# Patient Record
Sex: Male | Born: 1985
Health system: Southern US, Community
[De-identification: ages and names within clinical notes are randomized; demographics above are authoritative.]

## PROBLEM LIST (undated history)

## (undated) DIAGNOSIS — N200 Calculus of kidney: Secondary | ICD-10-CM

## (undated) HISTORY — PX: FRACTURE SURGERY: SHX138

## (undated) HISTORY — PX: KNEE RECONSTRUCTION: SHX5883

---
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
## 2010-04-20 ENCOUNTER — Emergency Department (HOSPITAL_COMMUNITY): Admission: EM | Admit: 2010-04-20 | Discharge: 2010-04-21 | Payer: Self-pay | Admitting: Emergency Medicine

## 2010-11-18 LAB — GC/CHLAMYDIA PROBE AMP, GENITAL
Chlamydia, DNA Probe: NEGATIVE
GC Probe Amp, Genital: NEGATIVE

## 2011-04-20 ENCOUNTER — Encounter: Payer: Self-pay | Admitting: Family Medicine

## 2011-10-05 ENCOUNTER — Ambulatory Visit: Payer: Self-pay | Admitting: Family Medicine

## 2012-12-01 ENCOUNTER — Emergency Department (HOSPITAL_BASED_OUTPATIENT_CLINIC_OR_DEPARTMENT_OTHER)
Admission: EM | Admit: 2012-12-01 | Discharge: 2012-12-01 | Disposition: A | Discharge status: Home / Self Care | Payer: BC Managed Care – PPO | Attending: Emergency Medicine | Admitting: Emergency Medicine

## 2012-12-01 ENCOUNTER — Encounter (HOSPITAL_BASED_OUTPATIENT_CLINIC_OR_DEPARTMENT_OTHER): Payer: Self-pay | Admitting: *Deleted

## 2012-12-01 DIAGNOSIS — Z8781 Personal history of (healed) traumatic fracture: Secondary | ICD-10-CM | POA: Insufficient documentation

## 2012-12-01 DIAGNOSIS — Z4802 Encounter for removal of sutures: Secondary | ICD-10-CM | POA: Insufficient documentation
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
## 2012-12-01 NOTE — ED Notes (Signed)
Pt states he had staples placed 10 days ago. No signs of infection.

## 2012-12-03 NOTE — ED Provider Notes (Signed)
Medical screening examination/treatment/procedure(s) were performed by non-physician practitioner and as supervising physician I was immediately available for consultation/collaboration.  Christopher J. Pollina, MD 12/03/12 0847 

## 2013-07-23 ENCOUNTER — Encounter (HOSPITAL_BASED_OUTPATIENT_CLINIC_OR_DEPARTMENT_OTHER): Payer: Self-pay | Admitting: Emergency Medicine

## 2013-07-23 ENCOUNTER — Emergency Department (HOSPITAL_BASED_OUTPATIENT_CLINIC_OR_DEPARTMENT_OTHER): Payer: Self-pay

## 2013-07-23 ENCOUNTER — Emergency Department (HOSPITAL_BASED_OUTPATIENT_CLINIC_OR_DEPARTMENT_OTHER)
Admission: EM | Admit: 2013-07-23 | Discharge: 2013-07-23 | Disposition: A | Discharge status: Home / Self Care | Payer: Self-pay | Attending: Emergency Medicine | Admitting: Emergency Medicine

## 2013-07-23 DIAGNOSIS — N451 Epididymitis: Secondary | ICD-10-CM

## 2013-07-23 DIAGNOSIS — N453 Epididymo-orchitis: Secondary | ICD-10-CM | POA: Insufficient documentation

## 2013-07-23 DIAGNOSIS — F17200 Nicotine dependence, unspecified, uncomplicated: Secondary | ICD-10-CM | POA: Insufficient documentation

## 2013-07-23 MED ORDER — OXYCODONE-ACETAMINOPHEN 5-325 MG PO TABS: 1.0000 | ORAL_TABLET | ORAL | Status: DC | PRN

## 2013-07-23 MED ORDER — OXYCODONE-ACETAMINOPHEN 5-325 MG PO TABS
2.0000 | ORAL_TABLET | Freq: Once | ORAL | Status: AC
  Administered 2013-07-23: 2 via ORAL
  Filled 2013-07-23: qty 2

## 2013-07-23 MED ORDER — IBUPROFEN 800 MG PO TABS: 800.0000 mg | ORAL_TABLET | Freq: Three times a day (TID) | ORAL | Status: DC

## 2013-07-23 MED ORDER — CEFTRIAXONE SODIUM 250 MG IJ SOLR
250.0000 mg | Freq: Once | INTRAMUSCULAR | Status: AC
  Administered 2013-07-23: 250 mg via INTRAMUSCULAR
  Filled 2013-07-23: qty 250

## 2013-07-23 MED ORDER — DOXYCYCLINE HYCLATE 100 MG PO TABS
100.0000 mg | ORAL_TABLET | Freq: Once | ORAL | Status: AC
  Administered 2013-07-23: 100 mg via ORAL
  Filled 2013-07-23: qty 1

## 2013-07-23 MED ORDER — DOXYCYCLINE HYCLATE 100 MG PO TABS: 100.0000 mg | ORAL_TABLET | Freq: Two times a day (BID) | ORAL | Status: DC

## 2013-07-23 NOTE — ED Notes (Signed)
Pt c/o left testicle pain x 2 days

## 2013-07-23 NOTE — ED Provider Notes (Signed)
Medical screening examination/treatment/procedure(s) were performed by non-physician practitioner and as supervising physician I was immediately available for consultation/collaboration.  EKG Interpretation   None         Gwyneth Sprout, MD 07/23/13 2136

## 2013-07-23 NOTE — ED Provider Notes (Signed)
CSN: [id number]     Arrival date & time 07/23/13  1827 History   First MD Initiated Contact with Patient 07/23/13 1843     Chief Complaint  Patient presents with  . Testicle Pain   (Consider location/radiation/quality/duration/timing/severity/associated sxs/prior Treatment) Patient is a 27 y.o. male presenting with testicular pain. The history is provided by the patient. No language interpreter was used.  Testicle Pain This is a new problem. The current episode started yesterday. The problem occurs constantly. The problem has been gradually worsening. Associated symptoms include abdominal pain. Pertinent negatives include no chills or fever. Associated symptoms comments: Left sided testicular pain for 2 days without injury. He denies testicular or scrotal swelling. No dysuria or difficulty with urination. He has lower abdominal pain as well. No N, V..    History reviewed. No pertinent past medical history. Past Surgical History  Procedure Laterality Date  . Fracture surgery    . Knee reconstruction     History reviewed. No pertinent family history. History  Substance Use Topics  . Smoking status: Current Every Day Smoker -- 0.50 packs/day    Types: Cigarettes  . Smokeless tobacco: Not on file  . Alcohol Use: No    Review of Systems  Constitutional: Negative for fever and chills.  Gastrointestinal: Positive for abdominal pain.  Genitourinary: Positive for testicular pain. Negative for dysuria, discharge, scrotal swelling and penile pain.  Musculoskeletal: Negative.   Skin: Negative.     Allergies  Review of patient's allergies indicates no known allergies.  Home Medications  No current outpatient prescriptions on file. BP 121/75  Pulse 59  Temp(Src) 98.6 F (37 C) (Oral)  Resp 16  Ht 6\' 2"  (1.88 m)  Wt 225 lb (102.059 kg)  BMI 28.88 kg/m2 Physical Exam  Constitutional: He is oriented to person, place, and time. He appears well-developed and well-nourished.  Neck:  Normal range of motion.  Pulmonary/Chest: Effort normal.  Abdominal: Soft.  Lower abdominal/suprapublic tenderness. No masses.   Genitourinary:  Circumcised penis. No scrotal swelling. Right testicle nontender. Left testicle significantly tender without swelling.   Musculoskeletal: Normal range of motion.  Neurological: He is alert and oriented to person, place, and time.  Skin: Skin is warm and dry.  Psychiatric: He has a normal mood and affect.    ED Course  Procedures (including critical care time) Labs Review Labs Reviewed - No data to display Imaging Review [place] Art/ven Flow Abd Pelv Doppler  07/23/2013   CLINICAL DATA:  Scrotal pain  EXAM: SCROTAL ULTRASOUND; SCROTAL DOPPLER ULTRASOUND  TECHNIQUE: Real-time and Doppler interrogation of the scrotal contents was performed.  COMPARISON:  None.  FINDINGS: Right testis measures 4.3 x 2.4 x 2.8 cm in size. Left testis measures 4.2 x 2.2 x 2.4 cm in size. There are no testicular masses. There is color flow in both testes, upper normal to borderline prominent on each side.  Both testes demonstrate low resistance waveforms. The peak systolic velocity in the right testis is 6 cm/sec. There is venous outflow on the right. On the left, the peak systolic velocity is 4 cm/sec. Venous outflow is demonstrated on the left.  There is no extratesticular mass or appreciable hydrocele on either side. The epididymal structures do not appear enlarged ; they do appear mildly hyperemic, however.  There are no varicoceles with Valsalva maneuver. There is no scrotal wall thickening or abscess.  IMPRESSION: Mild epididymal hyperemia bilaterally consistent with inflammation. Flow is noted each testis. There is borderline prominent blood flow to  each testis. Early orchitis may also be present. There is no demonstrable testicular mass or torsion. No evidence of hydrocele or abscess.   Electronically Signed   By: Bretta Bang M.D.   On: 07/23/2013 19:44    EKG  Interpretation   None       MDM  No diagnosis found. 1. Epididymitis  Will treat with antibiotics and recommend supportive treatment otherwise. Follow up urology if symptoms persist.    Arnoldo Hooker, PA-C 07/23/13 2053

## 2013-11-17 ENCOUNTER — Encounter (HOSPITAL_BASED_OUTPATIENT_CLINIC_OR_DEPARTMENT_OTHER): Payer: Self-pay | Admitting: Emergency Medicine

## 2013-11-17 ENCOUNTER — Emergency Department (HOSPITAL_BASED_OUTPATIENT_CLINIC_OR_DEPARTMENT_OTHER): Payer: BC Managed Care – PPO

## 2013-11-17 ENCOUNTER — Emergency Department (HOSPITAL_BASED_OUTPATIENT_CLINIC_OR_DEPARTMENT_OTHER)
Admission: EM | Admit: 2013-11-17 | Discharge: 2013-11-17 | Disposition: A | Discharge status: Home / Self Care | Payer: Self-pay | Attending: Emergency Medicine | Admitting: Emergency Medicine

## 2013-11-17 DIAGNOSIS — F17200 Nicotine dependence, unspecified, uncomplicated: Secondary | ICD-10-CM | POA: Insufficient documentation

## 2013-11-17 DIAGNOSIS — R519 Headache, unspecified: Secondary | ICD-10-CM

## 2013-11-17 DIAGNOSIS — Z792 Long term (current) use of antibiotics: Secondary | ICD-10-CM | POA: Insufficient documentation

## 2013-11-17 DIAGNOSIS — R5381 Other malaise: Secondary | ICD-10-CM | POA: Insufficient documentation

## 2013-11-17 DIAGNOSIS — R5383 Other fatigue: Secondary | ICD-10-CM

## 2013-11-17 DIAGNOSIS — R51 Headache: Secondary | ICD-10-CM | POA: Insufficient documentation

## 2013-11-17 DIAGNOSIS — Z87828 Personal history of other (healed) physical injury and trauma: Secondary | ICD-10-CM | POA: Insufficient documentation

## 2013-11-17 DIAGNOSIS — G8929 Other chronic pain: Secondary | ICD-10-CM | POA: Insufficient documentation

## 2013-11-17 DIAGNOSIS — Z791 Long term (current) use of non-steroidal anti-inflammatories (NSAID): Secondary | ICD-10-CM | POA: Insufficient documentation

## 2013-11-17 LAB — COMPREHENSIVE METABOLIC PANEL
ALBUMIN: 4.7 g/dL (ref 3.5–5.2)
ALK PHOS: 80 U/L (ref 39–117)
ALT: 14 U/L (ref 0–53)
AST: 16 U/L (ref 0–37)
BUN: 13 mg/dL (ref 6–23)
CO2: 26 mEq/L (ref 19–32)
Calcium: 9.8 mg/dL (ref 8.4–10.5)
Chloride: 101 mEq/L (ref 96–112)
Creatinine, Ser: 1.2 mg/dL (ref 0.50–1.35)
GFR calc Af Amer: >90 mL/min (ref >90)
GFR calc non Af Amer: 80 mL/min — ABNORMAL LOW (ref >90)
Glucose, Bld: 108 mg/dL — ABNORMAL HIGH (ref 70–99)
POTASSIUM: 3.5 meq/L — AB (ref 3.7–5.3)
SODIUM: 141 meq/L (ref 137–147)
TOTAL PROTEIN: 8.1 g/dL (ref 6.0–8.3)
Total Bilirubin: 0.9 mg/dL (ref 0.3–1.2)

## 2013-11-17 LAB — CBC WITH DIFFERENTIAL/PLATELET
BASOS PCT: 1 % (ref 0–1)
Basophils Absolute: 0.1 10*3/uL (ref 0.0–0.1)
EOS ABS: 0.2 10*3/uL (ref 0.0–0.7)
Eosinophils Relative: 2 % (ref 0–5)
HCT: 44.8 % (ref 39.0–52.0)
Hemoglobin: 15.9 g/dL (ref 13.0–17.0)
LYMPHS ABS: 3.8 10*3/uL (ref 0.7–4.0)
Lymphocytes Relative: 43 % (ref 12–46)
MCH: 29.4 pg (ref 26.0–34.0)
MCHC: 35.5 g/dL (ref 30.0–36.0)
MCV: 82.8 fL (ref 78.0–100.0)
Monocytes Absolute: 0.9 10*3/uL (ref 0.1–1.0)
Monocytes Relative: 10 % (ref 3–12)
NEUTROS PCT: 45 % (ref 43–77)
Neutro Abs: 4 10*3/uL (ref 1.7–7.7)
PLATELETS: 214 10*3/uL (ref 150–400)
RBC: 5.41 MIL/uL (ref 4.22–5.81)
RDW: 13.8 % (ref 11.5–15.5)
WBC: 8.9 10*3/uL (ref 4.0–10.5)

## 2013-11-17 LAB — RAPID URINE DRUG SCREEN, HOSP PERFORMED
Amphetamines: NOT DETECTED
Barbiturates: NOT DETECTED
Benzodiazepines: NOT DETECTED
Cocaine: NOT DETECTED
OPIATES: NOT DETECTED
TETRAHYDROCANNABINOL: POSITIVE — AB

## 2013-11-17 LAB — ETHANOL: Alcohol, Ethyl (B): <11 mg/dL (ref 0–11)

## 2013-11-17 MED ORDER — BUTALBITAL-APAP-CAFFEINE 50-325-40 MG PO TABS: 1.0000 | ORAL_TABLET | Freq: Four times a day (QID) | ORAL | Status: AC | PRN

## 2013-11-17 MED ORDER — SODIUM CHLORIDE 0.9 % IV SOLN
INTRAVENOUS | Status: DC
  Administered 2013-11-17: 125 mL/h via INTRAVENOUS

## 2013-11-17 MED ORDER — DIPHENHYDRAMINE HCL 50 MG/ML IJ SOLN
25.0000 mg | Freq: Once | INTRAMUSCULAR | Status: AC
  Administered 2013-11-17: 25 mg via INTRAVENOUS
  Filled 2013-11-17: qty 1

## 2013-11-17 MED ORDER — KETOROLAC TROMETHAMINE 30 MG/ML IJ SOLN
30.0000 mg | Freq: Once | INTRAMUSCULAR | Status: AC
  Administered 2013-11-17: 30 mg via INTRAVENOUS
  Filled 2013-11-17: qty 1
  Filled 2013-11-17: qty 2

## 2013-11-17 MED ORDER — METOCLOPRAMIDE HCL 5 MG/ML IJ SOLN
10.0000 mg | Freq: Once | INTRAMUSCULAR | Status: AC
  Administered 2013-11-17: 10 mg via INTRAVENOUS
  Filled 2013-11-17: qty 2

## 2013-11-17 NOTE — ED Notes (Signed)
MD at bedside discussing results. 

## 2013-11-17 NOTE — Discharge Instructions (Signed)

## 2013-11-17 NOTE — ED Notes (Signed)
dr. Freida BusmanAllen asks pt to raise arms and legs pt will not comply, pt now scratching his nose and wiping tears and crossing his legs.
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
## 2013-11-17 NOTE — ED Notes (Signed)
ekg and iv access obtained prior to pt registration, pt was brought back to room by t. Masten, rn, and dawn, emt. Eyes are fluttering, but pt will not open eyes or answer questions, ammonia capsule placed under pt nose, pt opens eyes and will now answer questions. Pt wife at bedside, reports pt c/o ha this am, was crying and hyperventilating so she brought him here for eval. Dr. Freida BusmanAllen now at bedside.

## 2014-02-06 ENCOUNTER — Emergency Department (HOSPITAL_BASED_OUTPATIENT_CLINIC_OR_DEPARTMENT_OTHER)
Admission: EM | Admit: 2014-02-06 | Discharge: 2014-02-07 | Disposition: A | Discharge status: Home / Self Care | Payer: BC Managed Care – PPO | Attending: Emergency Medicine | Admitting: Emergency Medicine

## 2014-02-06 DIAGNOSIS — R109 Unspecified abdominal pain: Secondary | ICD-10-CM | POA: Insufficient documentation

## 2014-02-06 DIAGNOSIS — F17200 Nicotine dependence, unspecified, uncomplicated: Secondary | ICD-10-CM | POA: Insufficient documentation

## 2014-02-06 DIAGNOSIS — R300 Dysuria: Secondary | ICD-10-CM | POA: Insufficient documentation

## 2014-02-06 DIAGNOSIS — R509 Fever, unspecified: Secondary | ICD-10-CM | POA: Insufficient documentation

## 2014-02-06 DIAGNOSIS — R350 Frequency of micturition: Secondary | ICD-10-CM | POA: Insufficient documentation

## 2014-02-06 DIAGNOSIS — Z792 Long term (current) use of antibiotics: Secondary | ICD-10-CM | POA: Insufficient documentation

## 2014-02-06 DIAGNOSIS — R197 Diarrhea, unspecified: Secondary | ICD-10-CM | POA: Insufficient documentation

## 2014-02-06 DIAGNOSIS — Z791 Long term (current) use of non-steroidal anti-inflammatories (NSAID): Secondary | ICD-10-CM | POA: Insufficient documentation

## 2014-02-06 LAB — URINALYSIS, ROUTINE W REFLEX MICROSCOPIC
BILIRUBIN URINE: NEGATIVE
GLUCOSE, UA: NEGATIVE mg/dL
Hgb urine dipstick: NEGATIVE
KETONES UR: NEGATIVE mg/dL
Leukocytes, UA: NEGATIVE
Nitrite: NEGATIVE
Protein, ur: NEGATIVE mg/dL
Specific Gravity, Urine: 1.022 (ref 1.005–1.030)
Urobilinogen, UA: 0.2 mg/dL (ref 0.0–1.0)
pH: 5.5 (ref 5.0–8.0)

## 2014-02-06 LAB — CBG MONITORING, ED: Glucose-Capillary: 121 mg/dL — ABNORMAL HIGH (ref 70–99)

## 2014-02-06 NOTE — ED Notes (Signed)
MD at bedside. 

## 2014-02-06 NOTE — ED Provider Notes (Signed)
CSN: [id number]     Arrival date & time 02/06/14  2318 History  This chart was scribed for Hanley Seamen, MD by Blanchard Kelch, ED Scribe. The patient was seen in room MH11/MH11. Patient's care was started at 11:57 PM.     Chief Complaint  Patient presents with  . Urinary Urgency      The history is provided by the patient. No language interpreter was used.    HPI Comments: Alan Sullivan is a 28 y.o. male who presents to the Emergency Department complaining of suprapubic abdominal pain that began a day and a half ago. He states the pain comes on when he begins to urinate after a few seconds. He has associated urinary urgency and frequency. He characterizes the pain as a burning sensation and rates the severity as "severe enough to come in." He reports a fever with a max temperature of 101.9 yesterday and diarrhea. He denies penile discharge.   No past medical history on file. Past Surgical History  Procedure Laterality Date  . Fracture surgery    . Knee reconstruction     No family history on file. History  Substance Use Topics  . Smoking status: Current Every Day Smoker -- 0.50 packs/day    Types: Cigarettes  . Smokeless tobacco: Not on file  . Alcohol Use: No    Review of Systems A complete 10 system review of systems was obtained and all systems are negative except as noted in the HPI and PMH.    Allergies  Review of patient's allergies indicates no known allergies.  Home Medications   Prior to Admission medications   Medication Sig Start Date End Date Taking? Authorizing Provider  butalbital-acetaminophen-caffeine (FIORICET) 50-325-40 MG per tablet Take 1-2 tablets by mouth every 6 (six) hours as needed for headache. 11/17/13 11/17/14  Toy Baker, MD  doxycycline (VIBRA-TABS) 100 MG tablet Take 1 tablet (100 mg total) by mouth 2 (two) times daily. 07/23/13   Shari A Upstill, PA-C  ibuprofen (ADVIL,MOTRIN) 800 MG tablet Take 1 tablet (800 mg total) by mouth 3  (three) times daily. 07/23/13   Shari A Upstill, PA-C  oxyCODONE-acetaminophen (PERCOCET/ROXICET) 5-325 MG per tablet Take 1 tablet by mouth every 4 (four) hours as needed for severe pain. 07/23/13   Arnoldo Hooker, PA-C   Triage Vitals: BP 127/83  Pulse 84  Temp(Src) 98.2 F (36.8 C) (Oral)  Resp 18  Ht 6\' 3"  (1.905 m)  Wt 230 lb (104.327 kg)  BMI 28.75 kg/m2  SpO2 98%  Physical Exam  Nursing note and vitals reviewed. General: Well-developed, well-nourished male in no acute distress; appearance consistent with age of record HENT: normocephalic; atraumatic Eyes: pupils equal, round and reactive to light; extraocular muscles intact Neck: supple Heart: regular rate and rhythm; no murmurs, rubs or gallops Lungs: clear to auscultation bilaterally Abdomen: soft; nondistended; nontender; no masses or hepatosplenomegaly; bowel sounds present Extremities: No deformity; full range of motion; pulses normal GU: no CVA tenderness; Tanner V male, circumcised; no urethral discharge; no prostate tenderness or enlargement Rectal: normal sphincter tone Neurologic: Awake, alert and oriented; motor function intact in all extremities and symmetric; no facial droop Skin: Warm and dry Psychiatric: Normal mood and affect   ED Course  Procedures (including critical care time)  DIAGNOSTIC STUDIES: Oxygen Saturation is 98% on room air, normal by my interpretation.    COORDINATION OF CARE: 12:01 AM - Patient verbalizes understanding and agrees with treatment plan.   MDM  Final diagnoses:  Dysuria   Nursing notes and vitals signs, including pulse oximetry, reviewed.  Summary of this visit's results, reviewed by myself:  Labs:  Results for orders placed during the hospital encounter of 02/06/14 (from the past 24 hour(s))  URINALYSIS, ROUTINE W REFLEX MICROSCOPIC     Status: None   Collection Time    02/06/14 11:34 PM      Result Value Ref Range   Color, Urine YELLOW  YELLOW   APPearance  CLEAR  CLEAR   Specific Gravity, Urine 1.022  1.005 - 1.030   pH 5.5  5.0 - 8.0   Glucose, UA NEGATIVE  NEGATIVE mg/dL   Hgb urine dipstick NEGATIVE  NEGATIVE   Bilirubin Urine NEGATIVE  NEGATIVE   Ketones, ur NEGATIVE  NEGATIVE mg/dL   Protein, ur NEGATIVE  NEGATIVE mg/dL   Urobilinogen, UA 0.2  0.0 - 1.0 mg/dL   Nitrite NEGATIVE  NEGATIVE   Leukocytes, UA NEGATIVE  NEGATIVE  CBG MONITORING, ED     Status: Abnormal   Collection Time    02/06/14 11:48 PM      Result Value Ref Range   Glucose-Capillary 121 (*) 70 - 99 mg/dL   16:1012:08 AM Patient's exam is not classic for urethritis given lack of a discharge but treatment for GC and Chlamydia seems prudent given this patient's age.  I personally performed the services described in this documentation, which was scribed in my presence. The recorded information has been reviewed and is accurate.    Hanley SeamenJohn L Tyleah Loh, MD 02/07/14 [phone]

## 2014-02-06 NOTE — ED Notes (Signed)
Pt reports "burning" sensation when urinating.  Denies n/v/d.  Reports having urgency.

## 2014-02-07 MED ORDER — CEFTRIAXONE SODIUM 250 MG IJ SOLR
250.0000 mg | Freq: Once | INTRAMUSCULAR | Status: AC
  Administered 2014-02-07: 250 mg via INTRAMUSCULAR
  Filled 2014-02-07: qty 250

## 2014-02-07 MED ORDER — LIDOCAINE HCL (PF) 1 % IJ SOLN
INTRAMUSCULAR | Status: AC
  Administered 2014-02-07
  Filled 2014-02-07: qty 5

## 2014-02-07 MED ORDER — AZITHROMYCIN 250 MG PO TABS
1000.0000 mg | ORAL_TABLET | Freq: Once | ORAL | Status: AC
  Administered 2014-02-07: 1000 mg via ORAL
  Filled 2014-02-07: qty 4

## 2014-02-07 NOTE — ED Notes (Signed)
Pt resting easy. NAD noted.

## 2014-02-09 LAB — GC/CHLAMYDIA PROBE AMP
CT Probe RNA: NEGATIVE
GC Probe RNA: NEGATIVE

## 2014-04-29 ENCOUNTER — Encounter (HOSPITAL_BASED_OUTPATIENT_CLINIC_OR_DEPARTMENT_OTHER): Payer: Self-pay | Admitting: Emergency Medicine

## 2014-04-29 ENCOUNTER — Emergency Department (HOSPITAL_BASED_OUTPATIENT_CLINIC_OR_DEPARTMENT_OTHER)
Admission: EM | Admit: 2014-04-29 | Discharge: 2014-04-30 | Disposition: A | Discharge status: Home / Self Care | Payer: BC Managed Care – PPO | Attending: Emergency Medicine | Admitting: Emergency Medicine

## 2014-04-29 DIAGNOSIS — H5789 Other specified disorders of eye and adnexa: Secondary | ICD-10-CM | POA: Insufficient documentation

## 2014-04-29 DIAGNOSIS — Z791 Long term (current) use of non-steroidal anti-inflammatories (NSAID): Secondary | ICD-10-CM | POA: Insufficient documentation

## 2014-04-29 DIAGNOSIS — Z792 Long term (current) use of antibiotics: Secondary | ICD-10-CM | POA: Insufficient documentation

## 2014-04-29 DIAGNOSIS — H109 Unspecified conjunctivitis: Secondary | ICD-10-CM

## 2014-04-29 DIAGNOSIS — F17200 Nicotine dependence, unspecified, uncomplicated: Secondary | ICD-10-CM | POA: Insufficient documentation

## 2014-04-29 MED ORDER — TETRACAINE HCL 0.5 % OP SOLN
OPHTHALMIC | Status: AC
  Filled 2014-04-29: qty 2

## 2014-04-29 MED ORDER — FLUORESCEIN SODIUM 1 MG OP STRP
ORAL_STRIP | OPHTHALMIC | Status: AC
  Filled 2014-04-29: qty 1

## 2014-04-29 NOTE — ED Notes (Signed)
Pt reports drainage to left eye with swelling and redness x 4 days.

## 2014-04-30 MED ORDER — SULFACETAMIDE SODIUM 10 % OP SOLN: 2.0000 [drp] | OPHTHALMIC | Status: DC

## 2014-04-30 NOTE — Discharge Instructions (Signed)
Conjunctivitis Conjunctivitis is commonly called "pink eye." Conjunctivitis can be caused by bacterial or viral infection, allergies, or injuries. There is usually redness of the lining of the eye, itching, discomfort, and sometimes discharge. There may be deposits of matter along the eyelids. A viral infection usually causes a watery discharge, while a bacterial infection causes a yellowish, thick discharge. Pink eye is very contagious and spreads by direct contact. You may be given antibiotic eyedrops as part of your treatment. Before using your eye medicine, remove all drainage from the eye by washing gently with warm water and Gibbons balls. Continue to use the medication until you have awakened 2 mornings in a row without discharge from the eye. Do not rub your eye. This increases the irritation and helps spread infection. Use separate towels from other household members. Wash your hands with soap and water before and after touching your eyes. Use cold compresses to reduce pain and sunglasses to relieve irritation from light. Do not wear contact lenses or wear eye makeup until the infection is gone. SEEK MEDICAL CARE IF:   Your symptoms are not better after 3 days of treatment.  You have increased pain or trouble seeing.  The outer eyelids become very red or swollen. Document Released: 09/28/2004 Document Revised: 11/13/2011 Document Reviewed: 08/21/2005 [facility] Patient Information 2015 [place], [place]. This information is not intended to replace advice given to you by your health care provider. Make sure you discuss any questions you have with your health care provider.  Sulfacetamide eye solution What is this medicine? SULFACETAMIDE (sul fa SEE ta mide) is a sulfonamide antibiotic. It is used to treat eye infections. This medicine may be used for other purposes; ask your health care provider or pharmacist if you have questions. COMMON BRAND NAME(S): Bleph-10, Ocu-Sul, Sodium Sulamyd,  Sulf-10 What should I tell my health care provider before I take this medicine? They need to know if you have any of these conditions: -eye injury or eye surgery -an unusual or allergic reaction to sulfacetamide, sulfa drugs, other medicines, foods, dyes, or preservatives -pregnant or trying to get pregnant -breast-feeding How should I use this medicine? This medicine is only for use in the eye. Do not take by mouth. Follow the directions on the prescription label. Wash hands before and after use. Tilt your head back slightly and pull your lower eyelid down with your index finger to form a pouch. Try not to touch the tip of the dropper to your eye, fingertips, or any other surface. Squeeze the prescribed number of drops into the pouch. Close the eye gently to spread the drops. Your vision may blur for a few minutes. Use your doses at regular intervals. Do not use your medicine more often than directed. Finish the full course prescribed by your doctor or health care professional even if you think your condition is better. Talk to your pediatrician regarding the use of this medicine in children. Special care may be needed. Overdosage: If you think you have taken too much of this medicine contact a poison control center or emergency room at once. NOTE: This medicine is only for you. Do not share this medicine with others. What if I miss a dose? If you miss a dose, use it as soon as you can. If it is almost time for your next dose, use only that dose. Do not use double or extra doses. What may interact with this medicine? -eye products that contain silver This list may not describe all possible interactions.  Give your health care provider a list of all the medicines, herbs, non-prescription drugs, or dietary supplements you use. Also tell them if you smoke, drink alcohol, or use illegal drugs. Some items may interact with your medicine. °What should I watch for while using this medicine? °Tell your  doctor or health care professional if your symptoms do not get better in 2 to 3 days. A full course of treatment is usually 7 to 10 days. °If you get any sign of an allergic reaction, stop using your eye product and call your doctor or health care professional. °Wear sunglasses if this medicine makes your eyes more sensitive to light. Keep out of the sun, or wear protective clothing outdoors and use a sunscreen. Do not use sun lamps or sun tanning beds or booths. °What side effects may I notice from receiving this medicine? °Side effects that you should report to your doctor or health care professional as soon as possible: °-blurred vision that does not go away °-burning, blistering, peeling, stinging, or itching of the eyes or eyelids, skin or mouth °-eye redness, swelling, or pain °Side effects that usually do not require medical attention (report to your doctor or health care professional if they continue or are bothersome): °-blurred vision for a few moments after application °This list may not describe all possible side effects. Call your doctor for medical advice about side effects. You may report side effects to FDA at 1-800-FDA-1088. °Where should I keep my medicine? °Keep out of the reach of children. °Store between 2 and 30 degrees C (36 and 86 degrees F). Do not freeze. Throw away any unused eye products after the expiration date. °NOTE: This sheet is a summary. It may not cover all possible information. If you have questions about this medicine, talk to your doctor, pharmacist, or health care provider. °© 2015, [employer]. (2008-04-24 13:04:42) ° °

## 2014-04-30 NOTE — ED Provider Notes (Signed)
CSN: [id number]     Arrival date & time 04/29/14  2243 History   First MD Initiated Contact with Patient 04/30/14 0022     Chief Complaint  Patient presents with  . Eye Drainage     (Consider location/radiation/quality/duration/timing/severity/associated sxs/prior Treatment) The history is provided by the patient.   28 year old male comes in with a three-day history of redness and drainage from his left eye. His lids are stuck together in the morning. The eye is itchy and mildly painful. Is no photophobia. He does not remember any sick contacts. He denies fever or chills. There is no nasal congestion.  History reviewed. No pertinent past medical history. Past Surgical History  Procedure Laterality Date  . Fracture surgery    . Knee reconstruction     No family history on file. History  Substance Use Topics  . Smoking status: Current Every Day Smoker -- 0.50 packs/day    Types: Cigarettes  . Smokeless tobacco: Not on file  . Alcohol Use: No    Review of Systems  All other systems reviewed and are negative.     Allergies  Review of patient's allergies indicates no known allergies.  Home Medications   Prior to Admission medications   Medication Sig Start Date End Date Taking? Authorizing Provider  butalbital-acetaminophen-caffeine (FIORICET) 50-325-40 MG per tablet Take 1-2 tablets by mouth every 6 (six) hours as needed for headache. 11/17/13 11/17/14  Toy Baker, MD  doxycycline (VIBRA-TABS) 100 MG tablet Take 1 tablet (100 mg total) by mouth 2 (two) times daily. 07/23/13   Shari A Upstill, PA-C  ibuprofen (ADVIL,MOTRIN) 800 MG tablet Take 1 tablet (800 mg total) by mouth 3 (three) times daily. 07/23/13   Shari A Upstill, PA-C  oxyCODONE-acetaminophen (PERCOCET/ROXICET) 5-325 MG per tablet Take 1 tablet by mouth every 4 (four) hours as needed for severe pain. 07/23/13   Shari A Upstill, PA-C  sulfacetamide (BLEPH-10) 10 % ophthalmic solution Place 2 drops into the left  eye every 3 (three) hours while awake. 04/30/14   Dione Booze, MD   BP 145/78  Pulse 80  Temp(Src) 97.7 F (36.5 C) (Oral)  Resp 18  Ht  (1.905 m)  Wt 225 lb (102.059 kg)  BMI 28.12 kg/m2  SpO2 100% Physical Exam  Nursing note and vitals reviewed.  28 year old male, resting comfortably and in no acute distress. Vital signs are significant for mild hypertension. Oxygen saturation is 100%, which is normal. Head is normocephalic and atraumatic. PERRLA, EOMI. Oropharynx is clear. There is mild erythema of the conjunctiva of the right eye and mild to moderate erythema the conjunctiva of the left eye. Anterior chamber is clear. There is no photophobia. There is no palpable preauricular lymph node. Neck is nontender and supple without adenopathy or JVD. Back is nontender and there is no CVA tenderness. Lungs are clear without rales, wheezes, or rhonchi. Chest is nontender. Heart has regular rate and rhythm without murmur. Abdomen is soft, flat, nontender without masses or hepatosplenomegaly and peristalsis is normoactive. Extremities have no cyanosis or edema, full range of motion is present. Skin is warm and dry without rash. Neurologic: Mental status is normal, cranial nerves are intact, there are no motor or sensory deficits.  ED Course  Procedures (including critical care time)  MDM   Final diagnoses:  Conjunctivitis, left eye    Conjunctivitis of the left eye-probably viral. He is discharged with prescription for sulfacetamide ophthalmic solution. He has noted some erythema of the conjunctiva  of the right high anion concerned that he may be about 2 to develop symptoms are not I. so he is given a refill on his sulfacetamide prescription. He is referred to ophthalmology for followup if not improving.    Dione Booze, MD 04/30/14 [phone]

## 2015-04-28 ENCOUNTER — Emergency Department (HOSPITAL_BASED_OUTPATIENT_CLINIC_OR_DEPARTMENT_OTHER)
Admission: EM | Admit: 2015-04-28 | Discharge: 2015-04-28 | Disposition: A | Discharge status: Home / Self Care | Payer: Self-pay | Attending: Emergency Medicine | Admitting: Emergency Medicine

## 2015-04-28 ENCOUNTER — Encounter (HOSPITAL_BASED_OUTPATIENT_CLINIC_OR_DEPARTMENT_OTHER): Payer: Self-pay | Admitting: Emergency Medicine

## 2015-04-28 DIAGNOSIS — Z720 Tobacco use: Secondary | ICD-10-CM | POA: Insufficient documentation

## 2015-04-28 DIAGNOSIS — J340 Abscess, furuncle and carbuncle of nose: Secondary | ICD-10-CM | POA: Insufficient documentation

## 2015-04-28 DIAGNOSIS — J0100 Acute maxillary sinusitis, unspecified: Secondary | ICD-10-CM | POA: Insufficient documentation

## 2015-04-28 MED ORDER — NAPROXEN 500 MG PO TABS: 500.0000 mg | ORAL_TABLET | Freq: Two times a day (BID) | ORAL | Status: DC

## 2015-04-28 MED ORDER — DOXYCYCLINE HYCLATE 100 MG PO CAPS: 100.0000 mg | ORAL_CAPSULE | Freq: Two times a day (BID) | ORAL | Status: DC

## 2015-04-28 MED ORDER — PSEUDOEPHEDRINE HCL ER 120 MG PO TB12: 120.0000 mg | ORAL_TABLET | Freq: Two times a day (BID) | ORAL | Status: DC

## 2015-04-28 NOTE — Discharge Instructions (Signed)

## 2015-04-28 NOTE — ED Notes (Signed)
Bump inside of nose, blurry vision in left eye.  Started 3 days ago.  Pt also has headache.  Clear drainage from nose.

## 2015-04-28 NOTE — ED Provider Notes (Signed)
CSN: [id number]     Arrival date & time 04/28/15  1545 History  This chart was scribed for Linwood Dibbles, MD by Octavia Heir, ED Scribe. This patient was seen in room MH09/MH09 and the patient's care was started at 6:26 PM.     Chief Complaint  Patient presents with  . Nose Problem      The history is provided by the patient. No language interpreter was used.   HPI Comments: AMARU BURROUGHS is a 29 y.o. male who presents to the Emergency Department complaining of constant, gradual worsening left sided facial swelling onset 3 days ago. Pt reports having an associated bump in the inside of his nose on the left side with blurry vision in his left eye and a headache. 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 Pt denies fevers, vomiting, chest pain, and shortness of breath.  No past medical history on file. Past Surgical History  Procedure Laterality Date  . Fracture surgery    . Knee reconstruction     No family history on file. Social History  Substance Use Topics  . Smoking status: Current Every Day Smoker -- 0.50 packs/day    Types: Cigarettes  . Smokeless tobacco: None  . Alcohol Use: No    Review of Systems  All other systems reviewed and are negative.     Allergies  Review of patient's allergies indicates no known allergies.  Home Medications   Prior to Admission medications   Medication Sig Start Date End Date Taking? Authorizing Provider  doxycycline (VIBRAMYCIN) 100 MG capsule Take 1 capsule (100 mg total) by mouth 2 (two) times daily. 04/28/15   Linwood Dibbles, MD  naproxen (NAPROSYN) 500 MG tablet Take 1 tablet (500 mg total) by mouth 2 (two) times daily. 04/28/15   Linwood Dibbles, MD  pseudoephedrine (SUDAFED 12 HOUR) 120 MG 12 hr tablet Take 1 tablet (120 mg total) by mouth every 12 (twelve) hours. 04/28/15   Linwood Dibbles, MD   Triage vitals: BP 137/91 mmHg  Pulse 77   Temp(Src) 98.6 F (37 C)  Resp 16  SpO2 100% Physical Exam  Constitutional: He appears well-developed and well-nourished. No distress.  HENT:  Head: Normocephalic and atraumatic.  Right Ear: External ear normal.  Left Ear: External ear normal.  Nose: Left sinus exhibits maxillary sinus tenderness.  Small papule noted on the left nares mucosal surface  Eyes: Conjunctivae are normal. Right eye exhibits no discharge. Left eye exhibits no discharge. No scleral icterus.  Neck: Neck supple. No tracheal deviation present.  Cardiovascular: Normal rate, regular rhythm and intact distal pulses.   Pulmonary/Chest: Effort normal and breath sounds normal. No stridor. No respiratory distress. He has no wheezes. He has no rales.  Abdominal: Soft. Bowel sounds are normal. He exhibits no distension. There is no tenderness. There is no rebound and no guarding.  Musculoskeletal: He exhibits no edema or tenderness.  Neurological: He is alert. He has normal strength. No cranial nerve deficit (no facial droop, extraocular movements intact, no slurred speech) or sensory deficit. He exhibits normal muscle tone. He displays no seizure activity. Coordination normal.  Skin: Skin is warm and dry. No rash noted.  Psychiatric: He has a normal mood and affect.  Nursing note and vitals reviewed.   ED Course  Procedures  DIAGNOSTIC STUDIES: Oxygen Saturation is 100% on RA, normal by my interpretation.  COORDINATION OF CARE:  6:31 PM Discussed treatment plan which includes antibiotics and follow up with PCP with pt at bedside and pt agreed to plan.   MDM   Final diagnoses:  Acute maxillary sinusitis, recurrence not specified  Furuncle of nose   Patient has a small papule inside his nose. Nothing that can be drained at this point. He also has some sinus tenderness. Started on a course of doxycycline. Continue warm compresses to the nose and apply antibiotics ointment. Prescription for Sudafed and Naprosyn.  Follow up with primary doctor  I personally performed the services described in this documentation, which was scribed in my presence.  The recorded information has been reviewed and is accurate.     Linwood Dibbles, MD 04/28/15 [phone]

## 2016-04-26 ENCOUNTER — Emergency Department (HOSPITAL_BASED_OUTPATIENT_CLINIC_OR_DEPARTMENT_OTHER)
Admission: EM | Admit: 2016-04-26 | Discharge: 2016-04-26 | Disposition: A | Discharge status: Home / Self Care | Payer: 59 | Attending: Emergency Medicine | Admitting: Emergency Medicine

## 2016-04-26 ENCOUNTER — Encounter (HOSPITAL_BASED_OUTPATIENT_CLINIC_OR_DEPARTMENT_OTHER): Payer: Self-pay

## 2016-04-26 DIAGNOSIS — J029 Acute pharyngitis, unspecified: Secondary | ICD-10-CM

## 2016-04-26 DIAGNOSIS — H66005 Acute suppurative otitis media without spontaneous rupture of ear drum, recurrent, left ear: Secondary | ICD-10-CM | POA: Diagnosis not present

## 2016-04-26 DIAGNOSIS — F17210 Nicotine dependence, cigarettes, uncomplicated: Secondary | ICD-10-CM | POA: Insufficient documentation

## 2016-04-26 DIAGNOSIS — H9202 Otalgia, left ear: Secondary | ICD-10-CM | POA: Diagnosis present

## 2016-04-26 MED ORDER — AMOXICILLIN-POT CLAVULANATE 875-125 MG PO TABS
1.0000 | ORAL_TABLET | Freq: Once | ORAL | Status: AC
  Administered 2016-04-26: 1 via ORAL
  Filled 2016-04-26: qty 1

## 2016-04-26 MED ORDER — AMOXICILLIN-POT CLAVULANATE 875-125 MG PO TABS: 1.0000 | ORAL_TABLET | Freq: Two times a day (BID) | ORAL | 0 refills | Status: DC

## 2016-04-26 MED FILL — AMOX-CLAV 875-125 MG TABLET: 875-125 | 14 days supply | Qty: 14 | Fill #0

## 2016-04-26 NOTE — ED Triage Notes (Signed)
C/o bilat earaches x "months"-c/o HA, "knot under my chin"-NAD-steady gait

## 2016-04-26 NOTE — ED Provider Notes (Signed)
[facility]   CSN: [id number] Arrival date & time: 04/26/16  1422     History   Chief Complaint Chief Complaint  Patient presents with  . Otalgia    HPI Alan Sullivan is a 30 y.o. male.  Patient presents to the emergency department with chief complaint of otalgia. He states that he gets frequent earaches, normally treats them with over-the-counter remedies. He states that he is having no success with his current episode. He also reports having a sore throat, and tender lymph nodes. He denies any associated fevers chills. Denies any nausea or vomiting. There are no modifying factors.   The history is provided by the patient. No language interpreter was used.    History reviewed. No pertinent past medical history.  There are no active problems to display for this patient.   Past Surgical History:  Procedure Laterality Date  . FRACTURE SURGERY    . KNEE RECONSTRUCTION         Home Medications    Prior to Admission medications   Not on File    Family History No family history on file.  Social History Social History  Substance Use Topics  . Smoking status: Current Every Day Smoker    Packs/day: 0.50    Types: Cigarettes  . Smokeless tobacco: Never Used  . Alcohol use No     Allergies   Review of patient's allergies indicates no known allergies.   Review of Systems Review of Systems  All other systems reviewed and are negative.    Physical Exam Updated Vital Signs BP 127/81 (BP Location: Left Arm)   Pulse 70   Temp 98.2 F (36.8 C) (Oral)   Resp 20   Ht 6\' 3"  (1.905 m)   Wt 99.8 kg   SpO2 98%   BMI 27.50 kg/m   Physical Exam  Constitutional: He is oriented to person, place, and time. He appears well-developed and well-nourished.  HENT:  Head: Normocephalic and atraumatic.  Left tympanic membrane is erythematous with purulence and bulging Right is clear Oropharynx is moderately erythematous, no abscess,  uvula is midline, airway intact, no stridor Submandibular and anterior cervical adenopathy  Eyes: Conjunctivae and EOM are normal. Pupils are equal, round, and reactive to light. Right eye exhibits no discharge. Left eye exhibits no discharge. No scleral icterus.  Neck: Normal range of motion. Neck supple. No JVD present.  Cardiovascular: Normal rate, regular rhythm and normal heart sounds.  Exam reveals no gallop and no friction rub.   No murmur heard. Pulmonary/Chest: Effort normal and breath sounds normal. No respiratory distress. He has no wheezes. He has no rales. He exhibits no tenderness.  Abdominal: Soft. He exhibits no distension and no mass. There is no tenderness. There is no rebound and no guarding.  Musculoskeletal: Normal range of motion. He exhibits no edema or tenderness.  Neurological: He is alert and oriented to person, place, and time.  Skin: Skin is warm and dry.  Psychiatric: He has a normal mood and affect. His behavior is normal. Judgment and thought content normal.  Nursing note and vitals reviewed.    ED Treatments / Results  Labs (all labs ordered are listed, but only abnormal results are displayed) Labs Reviewed - No data to display  EKG  EKG Interpretation None       Radiology No results found.  Procedures Procedures (including critical care time)  Medications Ordered in ED Medications  amoxicillin-clavulanate (AUGMENTIN) 875-125 MG per tablet 1 tablet (  1 tablet Oral Given 04/26/16 1529)     Initial Impression / Assessment and Plan / ED Course  I have reviewed the triage vital signs and the nursing notes.  Pertinent labs & imaging results that were available during my care of the patient were reviewed by me and considered in my medical decision making (see chart for details).  Clinical Course    Patient with pharyngitis and otitis media.  Will treat with abx.  Lymphadenopathy should resolve with treatment, if not, he will need to follow-up  with PCP.  He is well appearing.  He is stable and ready for discharge.  Final Clinical Impressions(s) / ED Diagnoses   Final diagnoses:  Recurrent acute suppurative otitis media without spontaneous rupture of left tympanic membrane  Pharyngitis    New Prescriptions New Prescriptions   AMOXICILLIN-CLAVULANATE (AUGMENTIN) 875-125 MG TABLET    Take 1 tablet by mouth every 12 (twelve) hours.     Roxy Horsemanobert Edrie Ehrich, PA-C 04/26/16 1538    Loren Raceravid Yelverton, MD 04/27/16 [phone]

## 2016-04-26 NOTE — Discharge Instructions (Signed)
Take antibiotics as directed.  If your symptoms do not improved in 3-5 days, see your Primary Care Doctor.  If you do not have a primary care doctor please see the information above.  Your swollen lymph nodes should resolve with treatment.  If they do not, see your doctor.  Return here for new or worsening symptoms.

## 2016-12-12 ENCOUNTER — Emergency Department (HOSPITAL_BASED_OUTPATIENT_CLINIC_OR_DEPARTMENT_OTHER)
Admission: EM | Admit: 2016-12-12 | Discharge: 2016-12-12 | Disposition: A | Discharge status: Home / Self Care | Payer: 59 | Attending: Emergency Medicine | Admitting: Emergency Medicine

## 2016-12-12 ENCOUNTER — Encounter (HOSPITAL_BASED_OUTPATIENT_CLINIC_OR_DEPARTMENT_OTHER): Payer: Self-pay | Admitting: *Deleted

## 2016-12-12 DIAGNOSIS — Y9389 Activity, other specified: Secondary | ICD-10-CM | POA: Insufficient documentation

## 2016-12-12 DIAGNOSIS — Y929 Unspecified place or not applicable: Secondary | ICD-10-CM | POA: Diagnosis not present

## 2016-12-12 DIAGNOSIS — S3992XA Unspecified injury of lower back, initial encounter: Secondary | ICD-10-CM | POA: Diagnosis present

## 2016-12-12 DIAGNOSIS — Y999 Unspecified external cause status: Secondary | ICD-10-CM | POA: Insufficient documentation

## 2016-12-12 DIAGNOSIS — F17210 Nicotine dependence, cigarettes, uncomplicated: Secondary | ICD-10-CM | POA: Diagnosis not present

## 2016-12-12 DIAGNOSIS — X500XXA Overexertion from strenuous movement or load, initial encounter: Secondary | ICD-10-CM | POA: Insufficient documentation

## 2016-12-12 DIAGNOSIS — S39012A Strain of muscle, fascia and tendon of lower back, initial encounter: Secondary | ICD-10-CM | POA: Insufficient documentation

## 2016-12-12 LAB — URINALYSIS, ROUTINE W REFLEX MICROSCOPIC
Bilirubin Urine: NEGATIVE
Glucose, UA: NEGATIVE mg/dL
HGB URINE DIPSTICK: NEGATIVE
KETONES UR: 15 mg/dL — AB
Leukocytes, UA: NEGATIVE
Nitrite: NEGATIVE
PROTEIN: NEGATIVE mg/dL
Specific Gravity, Urine: 1.021 (ref 1.005–1.030)
pH: 6 (ref 5.0–8.0)

## 2016-12-12 MED ORDER — METHOCARBAMOL 500 MG PO TABS: 500.0000 mg | ORAL_TABLET | Freq: Two times a day (BID) | ORAL | 0 refills | Status: DC

## 2016-12-12 MED ORDER — IBUPROFEN 600 MG PO TABS: 600.0000 mg | ORAL_TABLET | Freq: Four times a day (QID) | ORAL | 0 refills | Status: DC | PRN

## 2016-12-12 MED ORDER — LIDOCAINE 5 % EX PTCH: 1.0000 | MEDICATED_PATCH | CUTANEOUS | 0 refills | Status: DC

## 2016-12-12 NOTE — Discharge Instructions (Signed)
Expect your soreness to increase over the next 2-3 days. Take it easy, but do not lay around too much as this may make any stiffness worse.  °Antiinflammatory medications: Take 500 mg of naproxen every 12 hours or 600 mg of ibuprofen every 6 hours for the next 3 days. Take these medications with food to avoid upset stomach. Choose only one of these medications, do not take them together. ° °Muscle relaxer: Robaxin is a muscle relaxer and may help loosen stiff muscles. Do not take the Robaxin while driving or performing other dangerous activities.  ° °Lidocaine patches: These are available via either prescription or over-the-counter. The over-the-counter option may be more economical one and are likely just as effective. There are multiple over-the-counter brands, such as Salonpas. ° °Exercises: Be sure to perform the attached exercises starting with three times a week and working up to performing them daily. This is an essential part of preventing long term problems.  ° °Follow up with a primary care provider for any future management of these complaints. °

## 2016-12-12 NOTE — ED Triage Notes (Signed)
Pain in his lower back since yesterday. States pain is worse when he takes a breath. Muscle tightness.

## 2016-12-12 NOTE — ED Provider Notes (Signed)
[facility] Provider Note   CSN: [id number] Arrival date & time: 12/12/16  2040 By signing my name below, I, Levon Hedger, attest that this documentation has been prepared under the direction and in the presence of non-physician practitioner, Harolyn Rutherford, PA-C. Electronically Signed: Levon Hedger, Scribe. 12/12/2016. 10:21 PM.   History   Chief Complaint Chief Complaint  Patient presents with  . Back Pain   HPI Alan Sullivan is a 31 y.o. male who presents to the Emergency Department complaining of intermittent right lower back pain onset yesterday. He describes this as 7/10, intermittent tightness that is exacerbated with movement and inspiration and alleviated by sitting still. He reports associated intermittent shooting pain to right leg with ambulation. 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  Denies neuro deficits, changes in bowel or bladder function, or any other complaints.    The history is provided by the patient. No language interpreter was used.   History reviewed. No pertinent past medical history.  There are no active problems to display for this patient.  Past Surgical History:  Procedure Laterality Date  . FRACTURE SURGERY    . KNEE RECONSTRUCTION      Home Medications    Prior to Admission medications   Medication Sig Start Date End Date Taking? Authorizing Provider  amoxicillin-clavulanate (AUGMENTIN) 875-125 MG tablet Take 1 tablet by mouth every 12 (twelve) hours. 04/26/16   Roxy Horseman, PA-C  ibuprofen (ADVIL,MOTRIN) 600 MG tablet Take 1 tablet (600 mg total) by mouth every 6 (six) hours as needed. 12/12/16   Shawn C Joy, PA-C  lidocaine (LIDODERM) 5 % Place 1 patch onto the skin daily. Remove & Discard patch within 12 hours or as directed by MD 12/12/16   Anselm Pancoast, PA-C  methocarbamol (ROBAXIN) 500 MG tablet Take 1 tablet (500 mg total) by mouth 2 (two) times daily. 12/12/16   Anselm Pancoast, PA-C     Family History No family history on file.  Social History Social History  Substance Use Topics  . Smoking status: Current Every Day Smoker    Packs/day: 0.50    Types: Cigarettes  . Smokeless tobacco: Never Used  . Alcohol use No    Allergies   Patient has no allergy information on record.   Review of Systems Review of Systems  Musculoskeletal: Positive for back pain.  Neurological: Negative for weakness and numbness.   Physical Exam Updated Vital Signs BP 106/68   Pulse 68   Temp 98.1 F (36.7 C) (Oral)   Resp 20   Ht 6' 3.5" (1.918 m)   Wt 225 lb (102.1 kg)   SpO2 97%   BMI 27.75 kg/m   Physical Exam  Constitutional: He appears well-developed and well-nourished. No distress.  HENT:  Head: Normocephalic and atraumatic.  Eyes: Conjunctivae are normal.  Neck: Neck supple.  Cardiovascular: Normal rate, regular rhythm, normal heart sounds and intact distal pulses.   Pulmonary/Chest: Effort normal and breath sounds normal. No respiratory distress.  Abdominal: Soft. There is no tenderness. There is no guarding.  Musculoskeletal: He exhibits tenderness. He exhibits no edema.  Tenderness of the right lumbar musculature extending into the right buttocks. Normal motor function intact in all extremities and spine. No midline spinal tenderness.    Neurological: He is alert.  No sensory deficits. Strength 5/5 in both lower extremities. No gait disturbance. Coordination intact including heel to shin.  Skin: Skin is warm and dry. Capillary refill takes less  than 2 seconds. He is not diaphoretic.  Psychiatric: He has a normal mood and affect. His behavior is normal.  Nursing note and vitals reviewed.  ED Treatments / Results  DIAGNOSTIC STUDIES:  Oxygen Saturation is 97% on RA, normal by my interpretation.    COORDINATION OF CARE:  10:21 PM Discussed treatment plan with pt at bedside and pt agreed to plan.   Labs (all labs ordered are listed, but only abnormal  results are displayed) Labs Reviewed  URINALYSIS, ROUTINE W REFLEX MICROSCOPIC - Abnormal; Notable for the following:       Result Value   Ketones, ur 15 (*)    All other components within normal limits    EKG  EKG Interpretation None       Radiology No results found.  Procedures Procedures (including critical care time)  Medications Ordered in ED Medications - No data to display   Initial Impression / Assessment and Plan / ED Course  I have reviewed the triage vital signs and the nursing notes.  Pertinent labs & imaging results that were available during my care of the patient were reviewed by me and considered in my medical decision making (see chart for details).     Patient presents with symptoms consistent with possible lumbar strain. He has no neuro or functional deficits. The patient was given instructions for home care as well as return precautions. Patient voices understanding of these instructions, accepts the plan, and is comfortable with discharge.     Final Clinical Impressions(s) / ED Diagnoses   Final diagnoses:  Strain of lumbar region, initial encounter    New Prescriptions Discharge Medication List as of 12/12/2016 10:25 PM    START taking these medications   Details  ibuprofen (ADVIL,MOTRIN) 600 MG tablet Take 1 tablet (600 mg total) by mouth every 6 (six) hours as needed., Starting Tue 12/12/2016, Print    lidocaine (LIDODERM) 5 % Place 1 patch onto the skin daily. Remove & Discard patch within 12 hours or as directed by MD, Starting Tue 12/12/2016, Print    methocarbamol (ROBAXIN) 500 MG tablet Take 1 tablet (500 mg total) by mouth 2 (two) times daily., Starting Tue 12/12/2016, Print       I personally performed the services described in this documentation, which was scribed in my presence. The recorded information has been reviewed and is accurate.   Anselm Pancoast, PA-C 12/14/16 0725    Benjiman Core, MD 12/16/16 [phone]

## 2018-02-06 ENCOUNTER — Encounter (HOSPITAL_BASED_OUTPATIENT_CLINIC_OR_DEPARTMENT_OTHER): Payer: Self-pay | Admitting: *Deleted

## 2018-02-06 ENCOUNTER — Emergency Department (HOSPITAL_BASED_OUTPATIENT_CLINIC_OR_DEPARTMENT_OTHER)
Admission: EM | Admit: 2018-02-06 | Discharge: 2018-02-07 | Disposition: A | Discharge status: Home / Self Care | Payer: No Typology Code available for payment source | Attending: Emergency Medicine | Admitting: Emergency Medicine

## 2018-02-06 ENCOUNTER — Other Ambulatory Visit: Payer: Self-pay

## 2018-02-06 DIAGNOSIS — F17210 Nicotine dependence, cigarettes, uncomplicated: Secondary | ICD-10-CM | POA: Insufficient documentation

## 2018-02-06 DIAGNOSIS — Y9389 Activity, other specified: Secondary | ICD-10-CM | POA: Diagnosis not present

## 2018-02-06 DIAGNOSIS — S0993XA Unspecified injury of face, initial encounter: Secondary | ICD-10-CM

## 2018-02-06 DIAGNOSIS — Y999 Unspecified external cause status: Secondary | ICD-10-CM | POA: Diagnosis not present

## 2018-02-06 DIAGNOSIS — S025XXA Fracture of tooth (traumatic), initial encounter for closed fracture: Secondary | ICD-10-CM | POA: Diagnosis not present

## 2018-02-06 DIAGNOSIS — Y9289 Other specified places as the place of occurrence of the external cause: Secondary | ICD-10-CM | POA: Diagnosis not present

## 2018-02-06 DIAGNOSIS — X58XXXA Exposure to other specified factors, initial encounter: Secondary | ICD-10-CM | POA: Diagnosis not present

## 2018-02-06 NOTE — ED Triage Notes (Signed)
Pt c/o dental pain x  30 mins " cracked tooth while eating

## 2018-02-07 MED ORDER — PENICILLIN V POTASSIUM 500 MG PO TABS: 500.0000 mg | ORAL_TABLET | Freq: Four times a day (QID) | ORAL | 0 refills | Status: AC

## 2018-02-07 MED ORDER — PENICILLIN V POTASSIUM 250 MG PO TABS
500.0000 mg | ORAL_TABLET | Freq: Once | ORAL | Status: AC
  Administered 2018-02-07: 500 mg via ORAL
  Filled 2018-02-07: qty 2

## 2018-02-07 MED ORDER — BUPIVACAINE-EPINEPHRINE (PF) 0.5% -1:200000 IJ SOLN
1.8000 mL | Freq: Once | INTRAMUSCULAR | Status: AC
  Administered 2018-02-07: 1.8 mL
  Filled 2018-02-07: qty 1.8

## 2018-02-07 MED ORDER — HYDROCODONE-ACETAMINOPHEN 5-325 MG PO TABS: 1.0000 | ORAL_TABLET | Freq: Four times a day (QID) | ORAL | 0 refills | Status: DC | PRN

## 2018-02-07 NOTE — ED Provider Notes (Signed)
[facility] Provider Note: [name] Fiora Weill, MD, FACEP  CSN: [id number] MRN: [id number] ARRIVAL: 02/06/18 at 2340 ROOM: [facility]   CHIEF COMPLAINT  Dental Pain   HISTORY OF PRESENT ILLNESS  02/07/18 12:07 AM Alan ShellingQuentin J Sullivan is a 32 y.o. male who had his left upper second premolar fracture while eating about 30 minutes prior to arrival.  He is having moderate to severe pain at the site.  He has a history of a previous fracture to that tooth as well.  He has not taken anything for his pain.  Pain is worse with eating or drinking.   History reviewed. No pertinent past medical history.  Past Surgical History:  Procedure Laterality Date  . FRACTURE SURGERY    . KNEE RECONSTRUCTION      History reviewed. No pertinent family history.  Social History   Tobacco Use  . Smoking status: Current Every Day Smoker    Packs/day: 0.50    Types: Cigarettes  . Smokeless tobacco: Never Used  Substance Use Topics  . Alcohol use: No  . Drug use: No    Prior to Admission medications   Medication Sig Start Date End Date Taking? Authorizing Provider  ibuprofen (ADVIL,MOTRIN) 600 MG tablet Take 1 tablet (600 mg total) by mouth every 6 (six) hours as needed. 12/12/16   Joy, Shawn C, PA-C    Allergies Patient has no known allergies.   REVIEW OF SYSTEMS  Negative except as noted here or in the History of Present Illness.   PHYSICAL EXAMINATION  Initial Vital Signs Blood pressure 124/88, pulse 73, temperature 98.5 F (36.9 C), temperature source Oral, resp. rate 16, height 6\' 2"  (1.88 m), weight 104.3 kg (230 lb), SpO2 97 %.  Examination General: Well-developed, well-nourished male in no acute distress; appearance consistent with age of record HENT: normocephalic; atraumatic; left upper second premolar fractured below the gumline with roots visible in the socket Eyes: pupils equal, round and reactive to light; extraocular muscles intact Neck: supple Heart: regular rate and  rhythm Lungs: clear to auscultation bilaterally Abdomen: soft; nondistended present Extremities: No deformity; full range of motion Neurologic: Awake, alert and oriented; motor function intact in all extremities and symmetric; no facial droop Skin: Warm and dry Psychiatric: Normal mood and affect   RESULTS  Summary of this visit's results, reviewed by myself:   EKG Interpretation  Date/Time:    Ventricular Rate:    PR Interval:    QRS Duration:   QT Interval:    QTC Calculation:   R Axis:     Text Interpretation:        Laboratory Studies: No results found for this or any previous visit (from the past 24 hour(s)). Imaging Studies: No results found.  ED COURSE and MDM  Nursing notes and initial vitals signs, including pulse oximetry, reviewed.  Vitals:   02/06/18 2344  BP: 124/88  Pulse: 73  Resp: 16  Temp: 98.5 F (36.9 C)  TempSrc: Oral  SpO2: 97%  Weight: 104.3 kg (230 lb)  Height: 6\' 2"  (1.88 m)   We will start the patient on penicillin and refer to oral surgery for removal of the retained root.  [facility] [place] [facility] [place] reveals the patient has received no opioid prescriptions in the past 2 years.   PROCEDURES   DENTAL BLOCK 1.8 milliliters of 0.5% bupivacaine with epinephrine were injected into the buccal fold adjacent to the left upper second premolar. The patient tolerated this well  and there were no immediate complications. Adequate analgesia was obtained.   ED DIAGNOSES     ICD-10-CM   1. Dental injury, initial encounter S09.93XA        Camisha Srey, Jonny Ruiz, MD 02/07/18 [phone]

## 2018-06-05 ENCOUNTER — Emergency Department (HOSPITAL_BASED_OUTPATIENT_CLINIC_OR_DEPARTMENT_OTHER): Payer: Self-pay

## 2018-06-05 ENCOUNTER — Emergency Department (HOSPITAL_BASED_OUTPATIENT_CLINIC_OR_DEPARTMENT_OTHER)
Admission: EM | Admit: 2018-06-05 | Discharge: 2018-06-05 | Disposition: A | Discharge status: Home / Self Care | Payer: Self-pay | Attending: Emergency Medicine | Admitting: Emergency Medicine

## 2018-06-05 ENCOUNTER — Encounter (HOSPITAL_BASED_OUTPATIENT_CLINIC_OR_DEPARTMENT_OTHER): Payer: Self-pay

## 2018-06-05 DIAGNOSIS — F17210 Nicotine dependence, cigarettes, uncomplicated: Secondary | ICD-10-CM | POA: Insufficient documentation

## 2018-06-05 DIAGNOSIS — Z79899 Other long term (current) drug therapy: Secondary | ICD-10-CM | POA: Insufficient documentation

## 2018-06-05 DIAGNOSIS — Y990 Civilian activity done for income or pay: Secondary | ICD-10-CM | POA: Insufficient documentation

## 2018-06-05 DIAGNOSIS — X500XXA Overexertion from strenuous movement or load, initial encounter: Secondary | ICD-10-CM | POA: Insufficient documentation

## 2018-06-05 DIAGNOSIS — M25511 Pain in right shoulder: Secondary | ICD-10-CM | POA: Insufficient documentation

## 2018-06-05 MED ORDER — METHOCARBAMOL 500 MG PO TABS: 500.0000 mg | ORAL_TABLET | Freq: Two times a day (BID) | ORAL | 0 refills | Status: DC

## 2018-06-05 NOTE — ED Provider Notes (Signed)
[facility] EMERGENCY DEPARTMENT Provider Note   CSN: [id number] Arrival date & time: 06/05/18  1001     History   Chief Complaint Chief Complaint  Patient presents with  . Shoulder Pain    HPI Alan Sullivan is a 32 y.o. male who presents for evaluation of right shoulder pain that began last night while he was working.  He states while at work, he was lifting about a 25 pound object which required him to lift it off the ground and then lifted overhead in order to put it on a shelf.  He states that when he put it on the shelf, he felt a pull in his right shoulder and has had pain since then.  He reports he has had some numbness to the right arm since then.  He states he has had difficulty moving the right shoulder secondary to pain.  He states that it feels similar to when he tore the labrum of his left shoulder.  He states he has not taken any medication for pain.  He states that he came in today because he could not go to work because he had difficulty moving his shoulder.  Patient denies any vision changes, weakness/numbness of his left upper extremity, bilateral lower extremities.  The history is provided by the patient.    History reviewed. No pertinent past medical history.  There are no active problems to display for this patient.   Past Surgical History:  Procedure Laterality Date  . FRACTURE SURGERY    . KNEE RECONSTRUCTION          Home Medications    Prior to Admission medications   Medication Sig Start Date End Date Taking? Authorizing Provider  HYDROcodone-acetaminophen (NORCO) 5-325 MG tablet Take 1 tablet by mouth every 6 (six) hours as needed (for pain). 02/07/18   Molpus, John, MD  methocarbamol (ROBAXIN) 500 MG tablet Take 1 tablet (500 mg total) by mouth 2 (two) times daily. 06/05/18   Maxwell Caul, PA-C    Family History No family history on file.  Social History Social History   Tobacco Use  . Smoking status: Current Every Day  Smoker    Packs/day: 0.50    Types: Cigarettes  . Smokeless tobacco: Never Used  Substance Use Topics  . Alcohol use: No  . Drug use: No     Allergies   Patient has no known allergies.   Review of Systems Review of Systems  Eyes: Negative for visual disturbance.  Musculoskeletal:       Right shoulder pain  Neurological: Positive for weakness (right shoulder) and numbness (right upper extremity). Negative for facial asymmetry.  All other systems reviewed and are negative.    Physical Exam Updated Vital Signs BP (!) 138/94 (BP Location: Right Arm)   Pulse 78   Temp 99.1 F (37.3 C) (Oral)   Resp 18   SpO2 99%   Physical Exam  Constitutional: He appears well-developed and well-nourished.  HENT:  Head: Normocephalic and atraumatic.  Eyes: Conjunctivae and EOM are normal. Right eye exhibits no discharge. Left eye exhibits no discharge. No scleral icterus.  Cardiovascular:  Pulses:      Radial pulses are 2+ on the right side, and 2+ on the left side.  Pulmonary/Chest: Effort normal.  Musculoskeletal:  Limited range of motion of right shoulder secondary to pain.  Diffuse tenderness with no deformity or crepitus noted.  No overlying warmth, erythema.  He can achieve about 70 degrees of flexion  of right shoulder before significant pain.  Full extension intact without any difficulty.  He can only reach about 80 degrees abduction with the right shoulder.  Unable to assess Neer's impingement.  Positive Hawkins, positive empty can test, positive lift off test.  Full range of motion of right elbow intact with any difficulty.  No tenderness palpation right elbow, right wrist.  Full range of motion of left upper extremity without any difficulty.  Neurological: He is alert.  Subjective decreased sensation noted to the distal aspect of the right upper extremity.  Skin: Skin is warm and dry. Capillary refill takes less than 2 seconds.  Good distal cap refill. RUE is not dusky in  appearance or cool to touch.  Psychiatric: He has a normal mood and affect. His speech is normal and behavior is normal.  Nursing note and vitals reviewed.    ED Treatments / Results  Labs (all labs ordered are listed, but only abnormal results are displayed) Labs Reviewed - No data to display  EKG None  Radiology Dg Shoulder Right  Result Date: 06/05/2018 CLINICAL DATA:  Right shoulder pain EXAM: RIGHT SHOULDER - 2+ VIEW COMPARISON:  None. FINDINGS: There is no evidence of fracture or dislocation. There is no evidence of arthropathy or other focal bone abnormality. Soft tissues are unremarkable. IMPRESSION: Negative. Electronically Signed   By: Marlan Palau M.D.   On: 06/05/2018 10:43    Procedures Procedures (including critical care time)  Medications Ordered in ED Medications - No data to display   Initial Impression / Assessment and Plan / ED Course  I have reviewed the triage vital signs and the nursing notes.  Pertinent labs & imaging results that were available during my care of the patient were reviewed by me and considered in my medical decision making (see chart for details).     32 y.o. male who presents for evaluation of right shoulder pain after injuring it at work last night.  Reports heavy lifting and lifting over his head.  Reports some numbness to the distal aspect of the right upper extremity since then.  Also reports difficulty moving the right shoulder.  Was sent over by Workmen's Comp. Patient is afebrile, non-toxic appearing, sitting comfortably on examination table. Vital signs reviewed and stable.  On exam, limited range of motion of right shoulder secondary to pain.  He does have some subjective sensation decreases to the distal aspect of the right upper extremity.  Full range of motion of left upper extremity.  Low suspicion for fracture dislocation but a consideration.  Suspect either muscular skeletal injury versus rotator cuff pathology versus  tendon/ligament etiology. Will plan for XR evaluation.   X-ray reviewed.  Negative for any acute bony abnormality.  Discussed results with patient.  Explained to him that an x-ray will shows any bony abnormality but will miss a musculoskeletal, tendon, ligament injury.  We will plan to put him in sling immobilization and given outpatient orthopedic referral for further evaluation and potential further imaging.  Additionally, will give patient occupational health for him to follow-up regarding his Workmen's Comp.  Encourage at home supportive care measures. Patient had ample opportunity for questions and discussion. All patient's questions were answered with full understanding. Strict return precautions discussed. Patient expresses understanding and agreement to plan.   Final Clinical Impressions(s) / ED Diagnoses   Final diagnoses:  Acute pain of right shoulder    ED Discharge Orders         Ordered  methocarbamol (ROBAXIN) 500 MG tablet  2 times daily     06/05/18 1126           Rosana Hoes 06/05/18 1959    Alvira Monday, MD 06/06/18 [phone]

## 2018-06-05 NOTE — ED Triage Notes (Signed)
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

## 2018-06-05 NOTE — Discharge Instructions (Signed)
You can take Tylenol or Ibuprofen as directed for pain. You can alternate Tylenol and Ibuprofen every 4 hours. If you take Tylenol at 1pm, then you can take Ibuprofen at 5pm. Then you can take Tylenol again at 9pm.   Take Robaxin as prescribed. This medication will make you drowsy so do not drive or drink alcohol when taking it.  Wears a sling for support and stabilization.  As we discussed, please follow-up with referred orthopedic doctor for further evaluation.  Additionally, please follow-up with referred [place] employee health for occupational health and Worker's Comp.  Return the emergency department for any worsening pain, numbness/weakness of the arm, redness or swelling of the shoulder, fevers or any other worsening or concerning symptoms.

## 2018-06-06 ENCOUNTER — Ambulatory Visit (INDEPENDENT_AMBULATORY_CARE_PROVIDER_SITE_OTHER): Payer: Worker's Compensation | Admitting: Family Medicine

## 2018-06-06 ENCOUNTER — Ambulatory Visit: Payer: Self-pay

## 2018-06-06 ENCOUNTER — Encounter: Payer: Self-pay | Admitting: Family Medicine

## 2018-06-06 VITALS — BP 117/75 | HR 79 | Ht 75.0 in | Wt 207.0 lb

## 2018-06-06 DIAGNOSIS — M5010 Cervical disc disorder with radiculopathy, unspecified cervical region: Secondary | ICD-10-CM

## 2018-06-06 DIAGNOSIS — S4991XA Unspecified injury of right shoulder and upper arm, initial encounter: Secondary | ICD-10-CM

## 2018-06-06 MED ORDER — METHOCARBAMOL 500 MG PO TABS: 500.0000 mg | ORAL_TABLET | Freq: Three times a day (TID) | ORAL | 1 refills | Status: DC | PRN

## 2018-06-06 MED ORDER — PREDNISONE 10 MG PO TABS: ORAL_TABLET | ORAL | 0 refills | Status: DC

## 2018-06-06 NOTE — Patient Instructions (Signed)
You have a pinched nerve in your arm from a herniated disc. Prednisone 6 day dose pack to relieve irritation/inflammation of the nerve. Wait until you're done with the prednisone before taking aleve 2 tabs twice a day with food Robaxin three times a day as needed for muscle spasms (do not drive with this if it makes you sleepy). Simple range of motion exercises within limits of pain to prevent further stiffness. Consider physical therapy for stretching, exercises, traction, and modalities in the future. Heat 15 minutes at a time 3-4 times a day to help with spasms. Watch head position when on computers, texting, when sleeping in bed - should in line with back to prevent further nerve traction and irritation. Use the sling if needed while the medicine is working. If not improving we will consider an MRI. Follow up with me in 1 week.

## 2018-06-09 ENCOUNTER — Encounter: Payer: Self-pay | Admitting: Family Medicine

## 2018-06-09 NOTE — Progress Notes (Signed)
PCP: Patient, No Pcp Per  Subjective:   HPI: Patient is a 32 y.o. male here for neck, right shoulder pain.  Patient reports he was at work on 10/1 - job requires lifting, pushing, pulling. He was stacking something overhead that was 25-30 pounds when he felt a sharp pain in right shoulder/side of neck. Felt similar to when he tore labrum in his left shoulder but he has gone on to develop more severe pain at 8/10 level that is sharp and numbness, tingling from neck down into digits of his right hand (all digits). Unable to sleep due to this. Tried robaxin.  History reviewed. No pertinent past medical history.  No current outpatient medications on file prior to visit.   No current facility-administered medications on file prior to visit.     Past Surgical History:  Procedure Laterality Date  . FRACTURE SURGERY    . KNEE RECONSTRUCTION      No Known Allergies  Social History   Socioeconomic History  . Marital status: Single    Spouse name: Not on file  . Number of children: Not on file  . Years of education: Not on file  . Highest education level: Not on file  Occupational History  . Not on file  Social Needs  . Financial resource strain: Not on file  . Food insecurity:    Worry: Not on file    Inability: Not on file  . Transportation needs:    Medical: Not on file    Non-medical: Not on file  Tobacco Use  . Smoking status: Current Every Day Smoker    Packs/day: 0.50    Types: Cigarettes  . Smokeless tobacco: Never Used  Substance and Sexual Activity  . Alcohol use: No  . Drug use: No  . Sexual activity: Not on file  Lifestyle  . Physical activity:    Days per week: Not on file    Minutes per session: Not on file  . Stress: Not on file  Relationships  . Social connections:    Talks on phone: Not on file    Gets together: Not on file    Attends religious service: Not on file    Active member of club or organization: Not on file    Attends meetings of  clubs or organizations: Not on file    Relationship status: Not on file  . Intimate partner violence:    Fear of current or ex partner: Not on file    Emotionally abused: Not on file    Physically abused: Not on file    Forced sexual activity: Not on file  Other Topics Concern  . Not on file  Social History Narrative  . Not on file    History reviewed. No pertinent family history.  BP 117/75   Pulse 79   Ht 6\' 3"  (1.905 m)   Wt 207 lb (93.9 kg)   BMI 25.87 kg/m   Review of Systems: See HPI above.     Objective:  Physical Exam:  Gen: NAD, comfortable in exam room  Neck: No gross deformity, swelling, bruising. TTP right cervical paraspinal region and trapezius.  No midline/bony TTP. FROM with pain on right lateral rotation. BUE strength 5/5.   Sensation intact to light touch but tingling currently in all digits. 2+ equal reflexes in triceps, biceps, brachioradialis tendons. Positive right spurlings, negative left.  Right shoulder: No swelling, ecchymoses.  No gross deformity. No TTP. FROM with posterior pain all motions, painful arc. Negative  Ananias Pilgrim. Negative Yergasons. Strength 5/5 with empty can and resisted internal/external rotation. Negative apprehension. NV intact distally.   MSK u/s right shoulder:  Biceps tendon intact on long and trans views.  AC joint normal.  Subscapularis, infraspinatus, and supraspinatus intact though could not position in crass or modified crass positions.  Assessment & Plan:  1. Neck pain with radiation into right upper extremity - independently reviewed radiographs of shoulder and no abnormalities.  His history and exam is more consistent with disc herniation than shoulder pathology.  Start prednisone dose pack with robaxin as needed.  Heat for spasms.  Discussed ergonomic issues.  Sling if needed.  F/u in 1 week.  Consider MRI if not improving.

## 2018-06-13 ENCOUNTER — Encounter: Payer: Self-pay | Admitting: Family Medicine

## 2018-06-13 ENCOUNTER — Ambulatory Visit (INDEPENDENT_AMBULATORY_CARE_PROVIDER_SITE_OTHER): Payer: Worker's Compensation | Admitting: Family Medicine

## 2018-06-13 VITALS — BP 132/87 | HR 58 | Ht 75.0 in | Wt 200.0 lb

## 2018-06-13 DIAGNOSIS — M5010 Cervical disc disorder with radiculopathy, unspecified cervical region: Secondary | ICD-10-CM

## 2018-06-13 MED ORDER — DICLOFENAC SODIUM 75 MG PO TBEC: 75.0000 mg | DELAYED_RELEASE_TABLET | Freq: Two times a day (BID) | ORAL | 1 refills | Status: DC

## 2018-06-13 NOTE — Progress Notes (Signed)
PCP: Patient, No Pcp Per  Subjective:   HPI: Patient is a 32 y.o. male here for neck, right shoulder pain.  10/3: Patient reports he was at work on 10/1 - job requires lifting, pushing, pulling. He was stacking something overhead that was 25-30 pounds when he felt a sharp pain in right shoulder/side of neck. Felt similar to when he tore labrum in his left shoulder but he has gone on to develop more severe pain at 8/10 level that is sharp and numbness, tingling from neck down into digits of his right hand (all digits). Unable to sleep due to this. Tried robaxin.  10/10: Patient returns with continued pain at 7-8/10 level, sharp. Radiates from his neck down right arm, sharp. Worse with motions of neck - causes the radiation. Prednisone did not help much. Robaxin helps him sleep. Using ice and heat. No skin changes. Associated numbness/tingling into right hand still.  History reviewed. No pertinent past medical history.  Current Outpatient Medications on File Prior to Visit  Medication Sig Dispense Refill  . methocarbamol (ROBAXIN) 500 MG tablet Take 1 tablet (500 mg total) by mouth every 8 (eight) hours as needed. 60 tablet 1  . predniSONE (DELTASONE) 10 MG tablet 6 tabs po day 1, 5 tabs po day 2, 4 tabs po day 3, 3 tabs po day 4, 2 tabs po day 5, 1 tab po day 6 21 tablet 0   No current facility-administered medications on file prior to visit.     Past Surgical History:  Procedure Laterality Date  . FRACTURE SURGERY    . KNEE RECONSTRUCTION      No Known Allergies  Social History   Socioeconomic History  . Marital status: Single    Spouse name: Not on file  . Number of children: Not on file  . Years of education: Not on file  . Highest education level: Not on file  Occupational History  . Not on file  Social Needs  . Financial resource strain: Not on file  . Food insecurity:    Worry: Not on file    Inability: Not on file  . Transportation needs:    Medical:  Not on file    Non-medical: Not on file  Tobacco Use  . Smoking status: Current Every Day Smoker    Packs/day: 0.50    Types: Cigarettes  . Smokeless tobacco: Never Used  Substance and Sexual Activity  . Alcohol use: No  . Drug use: No  . Sexual activity: Not on file  Lifestyle  . Physical activity:    Days per week: Not on file    Minutes per session: Not on file  . Stress: Not on file  Relationships  . Social connections:    Talks on phone: Not on file    Gets together: Not on file    Attends religious service: Not on file    Active member of club or organization: Not on file    Attends meetings of clubs or organizations: Not on file    Relationship status: Not on file  . Intimate partner violence:    Fear of current or ex partner: Not on file    Emotionally abused: Not on file    Physically abused: Not on file    Forced sexual activity: Not on file  Other Topics Concern  . Not on file  Social History Narrative  . Not on file    History reviewed. No pertinent family history.  BP 132/87  Pulse (!) 58   Ht 6\' 3"  (1.905 m)   Wt 200 lb (90.7 kg)   BMI 25.00 kg/m   Review of Systems: See HPI above.     Objective:  Physical Exam:  Gen: NAD, comfortable in exam room  Neck: No gross deformity, swelling, bruising. TTP right cervical paraspinal region, trapezius.  No midline/bony TTP. FROM with pain extension and right lateral rotation. Right elbow extension 3/5, 5/5 other muscle groups bilateral upper extremities. Sensation diminished to light touch right hand. 2+ equal reflexes in triceps, biceps, brachioradialis tendons. Positive spurlings.   Assessment & Plan:  1. Neck pain with radiation into right upper extremity - concerning for disc herniation large enough that he's not responding to conservative treatment including prednisone dose pack.  Now with weakness as well.  Will go ahead with  MRI cervical spine to further assess, consider neurosurgery referral  following results.  Diclofenac with robaxin as needed.  F/u dependent on MRI.

## 2018-06-13 NOTE — Patient Instructions (Signed)
We will go ahead with an MRI of your cervical spine. Take diclofenac twice a day with food for pain and inflammation. Robaxin as needed for muscle spasms. Follow up will depend on the MRI.

## 2018-08-03 ENCOUNTER — Ambulatory Visit (HOSPITAL_BASED_OUTPATIENT_CLINIC_OR_DEPARTMENT_OTHER)
Admission: RE | Admit: 2018-08-03 | Discharge: 2018-08-03 | Disposition: A | Discharge status: Home / Self Care | Payer: Commercial Managed Care - PPO | Source: Ambulatory Visit | Attending: Family Medicine | Admitting: Family Medicine

## 2018-08-03 DIAGNOSIS — M50121 Cervical disc disorder at C4-C5 level with radiculopathy: Secondary | ICD-10-CM | POA: Diagnosis not present

## 2018-08-03 DIAGNOSIS — M5011 Cervical disc disorder with radiculopathy,  high cervical region: Secondary | ICD-10-CM | POA: Insufficient documentation

## 2018-08-03 DIAGNOSIS — M5010 Cervical disc disorder with radiculopathy, unspecified cervical region: Secondary | ICD-10-CM | POA: Diagnosis present

## 2018-08-07 ENCOUNTER — Ambulatory Visit (INDEPENDENT_AMBULATORY_CARE_PROVIDER_SITE_OTHER): Payer: Commercial Managed Care - PPO | Admitting: Family Medicine

## 2018-08-07 VITALS — BP 132/84 | Ht 75.0 in | Wt 220.0 lb

## 2018-08-07 DIAGNOSIS — M79601 Pain in right arm: Secondary | ICD-10-CM

## 2018-08-07 MED ORDER — GABAPENTIN 300 MG PO CAPS: 300.0000 mg | ORAL_CAPSULE | Freq: Three times a day (TID) | ORAL | 1 refills | Status: AC

## 2018-08-07 MED ORDER — DICLOFENAC SODIUM 75 MG PO TBEC: 75.0000 mg | DELAYED_RELEASE_TABLET | Freq: Two times a day (BID) | ORAL | 1 refills | Status: AC

## 2018-08-07 MED ORDER — METHOCARBAMOL 500 MG PO TABS: 500.0000 mg | ORAL_TABLET | Freq: Three times a day (TID) | ORAL | 1 refills | Status: DC | PRN

## 2018-08-07 NOTE — Patient Instructions (Addendum)
We will refer you to neurology for evaluation, possible nerve conduction studies. Start gabapentin 300mg  at bedtime - if you tolerate this and it doesn't make you too sleepy you can take up to 3 times a day. Diclofenac twice a day with food for pain and inflammation. Robaxin as needed for muscle spasms. Light duty at work - [employer]'ll fill out your paperwork also.

## 2018-08-08 ENCOUNTER — Encounter: Payer: Self-pay | Admitting: Family Medicine

## 2018-08-08 NOTE — Progress Notes (Signed)
PCP: Patient, No Pcp Per  Subjective:   HPI: Patient is a 32 y.o. male here for neck, right shoulder pain.  10/3: Patient reports he was at work on 10/1 - job requires lifting, pushing, pulling. He was stacking something overhead that was 25-30 pounds when he felt a sharp pain in right shoulder/side of neck. Felt similar to when he tore labrum in his left shoulder but he has gone on to develop more severe pain at 8/10 level that is sharp and numbness, tingling from neck down into digits of his right hand (all digits). Unable to sleep due to this. Tried robaxin.  10/10: Patient returns with continued pain at 7-8/10 level, sharp. Radiates from his neck down right arm, sharp. Worse with motions of neck - causes the radiation. Prednisone did not help much. Robaxin helps him sleep. Using ice and heat. No skin changes. Associated numbness/tingling into right hand still.  12/4: Patient returns following his cervical spine MRI. He reports he still has numbness from right side of neck/posterior shoulder radiating down to hand and all digits. Pain better at rest but worse with trying to move his right arm at the shoulder. Taking voltaren and diclofenac. Prednisone didn't seem to help him. No bowel/bladder dysfunction.  History reviewed. No pertinent past medical history.  No current outpatient medications on file prior to visit.   No current facility-administered medications on file prior to visit.     Past Surgical History:  Procedure Laterality Date  . FRACTURE SURGERY    . KNEE RECONSTRUCTION      No Known Allergies  Social History   Socioeconomic History  . Marital status: Single    Spouse name: Not on file  . Number of children: Not on file  . Years of education: Not on file  . Highest education level: Not on file  Occupational History  . Not on file  Social Needs  . Financial resource strain: Not on file  . Food insecurity:    Worry: Not on file    Inability:  Not on file  . Transportation needs:    Medical: Not on file    Non-medical: Not on file  Tobacco Use  . Smoking status: Current Every Day Smoker    Packs/day: 0.50    Types: Cigarettes  . Smokeless tobacco: Never Used  Substance and Sexual Activity  . Alcohol use: No  . Drug use: No  . Sexual activity: Not on file  Lifestyle  . Physical activity:    Days per week: Not on file    Minutes per session: Not on file  . Stress: Not on file  Relationships  . Social connections:    Talks on phone: Not on file    Gets together: Not on file    Attends religious service: Not on file    Active member of club or organization: Not on file    Attends meetings of clubs or organizations: Not on file    Relationship status: Not on file  . Intimate partner violence:    Fear of current or ex partner: Not on file    Emotionally abused: Not on file    Physically abused: Not on file    Forced sexual activity: Not on file  Other Topics Concern  . Not on file  Social History Narrative  . Not on file    History reviewed. No pertinent family history.  BP 132/84   Ht 6\' 3"  (1.905 m)   Wt 220 lb (99.8  kg)   BMI 27.50 kg/m   Review of Systems: See HPI above.     Objective:  Physical Exam:  Gen: NAD, comfortable in exam room  Neck: No gross deformity, swelling, bruising. TTP mildly right trapezius laterally.  No midline/bony TTP. FROM without pain now. BUE strength 5/5 except 3/5 right elbow extension and 4/5 right empty can.   Sensation intact to light touch currently.   2+ equal reflexes in triceps, biceps, brachioradialis tendons. 2+ radial pulses  Right shoulder: Mild scapular winging.  No swelling, ecchymoses.   TTP lateral trapezius.  No other tenderness. FROM but painful with abduction and flexion actively. Negative Hawkins, Neers. Negative Yergasons. Strength 4/5 with empty can and 5/5 resisted internal/external rotation. Negative apprehension. NV intact  distally.  MSK u/s right shoulder:  Biceps tendon intact on long and trans views.  AC joint normal.  Subscapularis, infraspinatus, supraspinatus all normal without evidence tear - able to position in modified crass today.  Assessment & Plan:  1. Neck pain, right arm pain/numbness - MRI reviewed with patient and no evidence of abnormality to account for his pain, weakness, and numbness.  Shoulder imaging also reassuring.  This is consistent with a brachial neuritis/Parsonage Turner Syndrome.  Advised we go ahead with referral to neurology for further evaluation, likely NCVs/EMGs.  Start gabapentin and will titrate up.  Diclofenac with robaxin as needed.  He wants to return to work but will have to do so with restrictions while waiting on resolution of his brachial neuritis.  Do not think he would tolerate physical therapy yet but will refer for this in the future.  F/u in 6 weeks.

## 2018-11-02 ENCOUNTER — Encounter (HOSPITAL_BASED_OUTPATIENT_CLINIC_OR_DEPARTMENT_OTHER): Payer: Self-pay | Admitting: *Deleted

## 2018-11-02 ENCOUNTER — Emergency Department (HOSPITAL_BASED_OUTPATIENT_CLINIC_OR_DEPARTMENT_OTHER)
Admission: EM | Admit: 2018-11-02 | Discharge: 2018-11-02 | Disposition: A | Discharge status: Home / Self Care | Payer: Commercial Managed Care - PPO | Attending: Emergency Medicine | Admitting: Emergency Medicine

## 2018-11-02 ENCOUNTER — Other Ambulatory Visit: Payer: Self-pay

## 2018-11-02 DIAGNOSIS — R197 Diarrhea, unspecified: Secondary | ICD-10-CM | POA: Insufficient documentation

## 2018-11-02 DIAGNOSIS — Z79899 Other long term (current) drug therapy: Secondary | ICD-10-CM | POA: Diagnosis not present

## 2018-11-02 DIAGNOSIS — F17210 Nicotine dependence, cigarettes, uncomplicated: Secondary | ICD-10-CM | POA: Insufficient documentation

## 2018-11-02 DIAGNOSIS — R112 Nausea with vomiting, unspecified: Secondary | ICD-10-CM | POA: Diagnosis not present

## 2018-11-02 DIAGNOSIS — R1110 Vomiting, unspecified: Secondary | ICD-10-CM | POA: Diagnosis present

## 2018-11-02 LAB — CBC
HCT: 44.2 % (ref 39.0–52.0)
Hemoglobin: 14.9 g/dL (ref 13.0–17.0)
MCH: 29.2 pg (ref 26.0–34.0)
MCHC: 33.7 g/dL (ref 30.0–36.0)
MCV: 86.5 fL (ref 80.0–100.0)
Platelets: 218 10*3/uL (ref 150–400)
RBC: 5.11 MIL/uL (ref 4.22–5.81)
RDW: 13.9 % (ref 11.5–15.5)
WBC: 8.9 10*3/uL (ref 4.0–10.5)
nRBC: 0 % (ref 0.0–0.2)

## 2018-11-02 LAB — BASIC METABOLIC PANEL
Anion gap: 5 (ref 5–15)
BUN: 12 mg/dL (ref 6–20)
CALCIUM: 9.2 mg/dL (ref 8.9–10.3)
CO2: 28 mmol/L (ref 22–32)
Chloride: 104 mmol/L (ref 98–111)
Creatinine, Ser: 1.1 mg/dL (ref 0.61–1.24)
GFR calc Af Amer: >60 mL/min (ref >60)
Glucose, Bld: 81 mg/dL (ref 70–99)
Potassium: 4.3 mmol/L (ref 3.5–5.1)
Sodium: 137 mmol/L (ref 135–145)

## 2018-11-02 MED ORDER — ONDANSETRON HCL 4 MG/2ML IJ SOLN
4.0000 mg | Freq: Once | INTRAMUSCULAR | Status: AC
  Administered 2018-11-02: 4 mg via INTRAVENOUS
  Filled 2018-11-02: qty 2

## 2018-11-02 MED ORDER — ONDANSETRON 8 MG PO TBDP: 8.0000 mg | ORAL_TABLET | Freq: Three times a day (TID) | ORAL | 0 refills | Status: DC | PRN

## 2018-11-02 MED ORDER — SODIUM CHLORIDE 0.9 % IV BOLUS
1000.0000 mL | Freq: Once | INTRAVENOUS | Status: AC
  Administered 2018-11-02: 1000 mL via INTRAVENOUS

## 2018-11-02 MED ORDER — LOPERAMIDE HCL 2 MG PO CAPS: 2.0000 mg | ORAL_CAPSULE | Freq: Four times a day (QID) | ORAL | 0 refills | Status: AC | PRN

## 2018-11-02 NOTE — ED Notes (Signed)
Patient fully dressed and standing at the door.

## 2018-11-02 NOTE — ED Triage Notes (Signed)
Pt reports n/v/d x 2 days after eating at [facility]. Using OTC meds without relief. Vomited x 2 today, Diarrhea "all morning"

## 2018-11-02 NOTE — Discharge Instructions (Addendum)
Take the medications as prescribed, drink plenty of fluids, follow-up with a primary care doctor if not improving in the next couple days, return to the ER for worsening symptoms

## 2018-11-02 NOTE — ED Notes (Signed)
ED Provider at bedside. 

## 2018-11-02 NOTE — ED Provider Notes (Signed)
[facility] EMERGENCY DEPARTMENT Provider Note   CSN: [id number] Arrival date & time: 11/02/18  1527    History   Chief Complaint Chief Complaint  Patient presents with  . Emesis  . Diarrhea    HPI Alan Sullivan is a 33 y.o. male.   HPI Patient presents to the emergency room for evaluation of vomiting and diarrhea.  Patient states the symptoms started a couple days ago.  He has had constant episodes of vomiting and diarrhea.  At least several times per day.  Patient states he tried taking Tums and Pepto-Bismol without relief.  He has had numerous episodes of diarrhea this morning as well as 2 episodes of vomiting.  He denies any fevers or chills.  No blood in his stool.  No recent trips or travel.  No recent antibiotics. History reviewed. No pertinent past medical history.  There are no active problems to display for this patient.   Past Surgical History:  Procedure Laterality Date  . FRACTURE SURGERY    . KNEE RECONSTRUCTION          Home Medications    Prior to Admission medications   Medication Sig Start Date End Date Taking? Authorizing Provider  diclofenac (VOLTAREN) 75 MG EC tablet Take 1 tablet (75 mg total) by mouth 2 (two) times daily. 08/07/18   Hudnall, Azucena Fallen, MD  gabapentin (NEURONTIN) 300 MG capsule Take 1 capsule (300 mg total) by mouth 3 (three) times daily. 08/07/18   Hudnall, Azucena Fallen, MD  loperamide (IMODIUM) 2 MG capsule Take 1 capsule (2 mg total) by mouth 4 (four) times daily as needed for diarrhea or loose stools. 11/02/18   Linwood Dibbles, MD  methocarbamol (ROBAXIN) 500 MG tablet Take 1 tablet (500 mg total) by mouth every 8 (eight) hours as needed. 08/07/18   Hudnall, Azucena Fallen, MD  ondansetron (ZOFRAN ODT) 8 MG disintegrating tablet Take 1 tablet (8 mg total) by mouth every 8 (eight) hours as needed for nausea or vomiting. 11/02/18   Linwood Dibbles, MD    Family History No family history on file.  Social History Social History   Tobacco  Use  . Smoking status: Current Every Day Smoker    Packs/day: 0.50    Types: Cigarettes  . Smokeless tobacco: Never Used  Substance Use Topics  . Alcohol use: No  . Drug use: No     Allergies   Patient has no known allergies.   Review of Systems Review of Systems  All other systems reviewed and are negative.    Physical Exam Updated Vital Signs BP 134/77 (BP Location: Left Arm)   Pulse 62   Temp 98.6 F (37 C) (Oral)   Resp 16   Ht 1.905 m (6\' 3" )   Wt 99.8 kg   SpO2 100%   BMI 27.50 kg/m   Physical Exam Vitals signs and nursing note reviewed.  Constitutional:      General: He is not in acute distress.    Appearance: He is well-developed.  HENT:     Head: Normocephalic and atraumatic.     Right Ear: External ear normal.     Left Ear: External ear normal.  Eyes:     General: No scleral icterus.       Right eye: No discharge.        Left eye: No discharge.     Conjunctiva/sclera: Conjunctivae normal.  Neck:     Musculoskeletal: Neck supple.     Trachea: No tracheal  deviation.  Cardiovascular:     Rate and Rhythm: Normal rate and regular rhythm.  Pulmonary:     Effort: Pulmonary effort is normal. No respiratory distress.     Breath sounds: Normal breath sounds. No stridor. No wheezing or rales.  Abdominal:     General: Bowel sounds are normal. There is no distension.     Palpations: Abdomen is soft.     Tenderness: There is no abdominal tenderness. There is no guarding or rebound.  Musculoskeletal:        General: No tenderness.  Skin:    General: Skin is warm and dry.     Findings: No rash.  Neurological:     Mental Status: He is alert.     Cranial Nerves: No cranial nerve deficit (no facial droop, extraocular movements intact, no slurred speech).     Sensory: No sensory deficit.     Motor: No abnormal muscle tone or seizure activity.     Coordination: Coordination normal.      ED Treatments / Results  Labs (all labs ordered are listed, but  only abnormal results are displayed) Labs Reviewed  CBC  BASIC METABOLIC PANEL      Procedures Procedures (including critical care time)  Medications Ordered in ED Medications  sodium chloride 0.9 % bolus 1,000 mL (0 mLs Intravenous Stopped 11/02/18 1718)  ondansetron (ZOFRAN) injection 4 mg (4 mg Intravenous Given 11/02/18 1621)     Initial Impression / Assessment and Plan / ED Course  I have reviewed the triage vital signs and the nursing notes.  Pertinent labs & imaging results that were available during my care of the patient were reviewed by me and considered in my medical decision making (see chart for details).   Patient presented to the emergency room for evaluation of.  Patient had several episodes of vomiting and diarrhea.  No pain.  Exam is reassuring.  He has no abdominal tenderness.  Laboratory tests are unremarkable.  Suspect viral illness.  Patient appears stable for discharge.  Final Clinical Impressions(s) / ED Diagnoses   Final diagnoses:  Nausea vomiting and diarrhea    ED Discharge Orders         Ordered    ondansetron (ZOFRAN ODT) 8 MG disintegrating tablet  Every 8 hours PRN     11/02/18 1734    loperamide (IMODIUM) 2 MG capsule  4 times daily PRN     11/02/18 1734           Linwood Dibbles, MD 11/02/18 1738

## 2019-03-28 ENCOUNTER — Emergency Department (HOSPITAL_BASED_OUTPATIENT_CLINIC_OR_DEPARTMENT_OTHER)
Admission: EM | Admit: 2019-03-28 | Discharge: 2019-03-29 | Disposition: A | Discharge status: Home / Self Care | Payer: Commercial Managed Care - PPO | Attending: Emergency Medicine | Admitting: Emergency Medicine

## 2019-03-28 ENCOUNTER — Other Ambulatory Visit: Payer: Self-pay

## 2019-03-28 ENCOUNTER — Encounter (HOSPITAL_BASED_OUTPATIENT_CLINIC_OR_DEPARTMENT_OTHER): Payer: Self-pay | Admitting: *Deleted

## 2019-03-28 DIAGNOSIS — Z79899 Other long term (current) drug therapy: Secondary | ICD-10-CM | POA: Insufficient documentation

## 2019-03-28 DIAGNOSIS — Z20828 Contact with and (suspected) exposure to other viral communicable diseases: Secondary | ICD-10-CM | POA: Insufficient documentation

## 2019-03-28 DIAGNOSIS — F17210 Nicotine dependence, cigarettes, uncomplicated: Secondary | ICD-10-CM | POA: Insufficient documentation

## 2019-03-28 DIAGNOSIS — R197 Diarrhea, unspecified: Secondary | ICD-10-CM | POA: Insufficient documentation

## 2019-03-28 DIAGNOSIS — R509 Fever, unspecified: Secondary | ICD-10-CM | POA: Insufficient documentation

## 2019-03-28 DIAGNOSIS — Z20822 Contact with and (suspected) exposure to covid-19: Secondary | ICD-10-CM

## 2019-03-28 NOTE — ED Triage Notes (Addendum)
Fever and diarrhea today. He was sent home from work. He needs a work note stating he does not have Covid before he can go back to work.

## 2019-03-29 LAB — SARS CORONAVIRUS 2 BY RT PCR (HOSPITAL ORDER, PERFORMED IN ~~LOC~~ HOSPITAL LAB): SARS Coronavirus 2: NEGATIVE

## 2019-03-29 NOTE — ED Provider Notes (Signed)
[facility] Provider Note: [name] Doyle Tegethoff, MD, FACEP  CSN: [id number] MRN: [id number] ARRIVAL: 03/28/19 at 2159 ROOM: MH02/[facility]02   CHIEF COMPLAINT  Fever and Diarrhea   HISTORY OF PRESENT ILLNESS  03/29/19 12:22 AM Saundra ShellingQuentin J Rhyner is a 33 y.o. male who had some mild diarrhea yesterday morning.  When he went to work yesterday a forehead temperature screening showed he had a temperature of 100.4.  His employer required him to get a note saying he was negative for COVID-19 before they will let him return to work.  He denies nasal congestion, sore throat, body aches, fatigue, cough, shortness of breath, abdominal pain, nausea or vomiting.  He denies COVID-19 exposure.   History reviewed. No pertinent past medical history.  Past Surgical History:  Procedure Laterality Date  . FRACTURE SURGERY    . KNEE RECONSTRUCTION      No family history on file.  Social History   Tobacco Use  . Smoking status: Current Every Day Smoker    Packs/day: 0.50    Types: Cigarettes  . Smokeless tobacco: Never Used  Substance Use Topics  . Alcohol use: No  . Drug use: No    Prior to Admission medications   Medication Sig Start Date End Date Taking? Authorizing Provider  diclofenac (VOLTAREN) 75 MG EC tablet Take 1 tablet (75 mg total) by mouth 2 (two) times daily. 08/07/18   Hudnall, Azucena FallenShane R, MD  gabapentin (NEURONTIN) 300 MG capsule Take 1 capsule (300 mg total) by mouth 3 (three) times daily. 08/07/18   Hudnall, Azucena FallenShane R, MD  loperamide (IMODIUM) 2 MG capsule Take 1 capsule (2 mg total) by mouth 4 (four) times daily as needed for diarrhea or loose stools. 11/02/18   Linwood DibblesKnapp, Jon, MD  methocarbamol (ROBAXIN) 500 MG tablet Take 1 tablet (500 mg total) by mouth every 8 (eight) hours as needed. 08/07/18   Hudnall, Azucena FallenShane R, MD  ondansetron (ZOFRAN ODT) 8 MG disintegrating tablet Take 1 tablet (8 mg total) by mouth every 8 (eight) hours as needed for nausea or vomiting. 11/02/18   Linwood DibblesKnapp, Jon, MD    Allergies Patient has no known allergies.   REVIEW OF SYSTEMS  Negative except as noted here or in the History of Present Illness.   PHYSICAL EXAMINATION  Initial Vital Signs Blood pressure 132/78, pulse 78, temperature 98.6 F (37 C), temperature source Oral, resp. rate 20, height 6\' 3"  (1.905 m), weight 103.4 kg, SpO2 98 %.  Examination General: Well-developed, well-nourished male in no acute distress; appearance consistent with age of record HENT: normocephalic; atraumatic Eyes: pupils equal, round and reactive to light; extraocular muscles intact Neck: supple Heart: regular rate and rhythm Lungs: clear to auscultation bilaterally Abdomen: soft; nondistended; nontender; bowel sounds present Extremities: No deformity; full range of motion; pulses normal Neurologic: Awake, alert and oriented; motor function intact in all extremities and symmetric; no facial droop Skin: Warm and dry Psychiatric: Normal mood and affect   RESULTS  Summary of this visit's results, reviewed by myself:   EKG Interpretation  Date/Time:    Ventricular Rate:    PR Interval:    QRS Duration:   QT Interval:    QTC Calculation:   R Axis:     Text Interpretation:        Laboratory Studies: Results for orders placed or performed during the hospital encounter of 03/28/19 (from the past 24 hour(s))  SARS Coronavirus 2 ([facility])     Status: None  Collection Time: 03/28/19 11:44 PM   Specimen: Nasopharyngeal Swab  Result Value Ref Range   SARS Coronavirus 2 NEGATIVE NEGATIVE   Imaging Studies: No results found.  ED COURSE and MDM  Nursing notes and initial vitals signs, including pulse oximetry, reviewed.  Vitals:   03/28/19 2206 03/28/19 2209  BP:  132/78  Pulse:  78  Resp:  20  Temp:  98.6 F (37 C)  TempSrc:  Oral  SpO2:  98%  Weight: 103.4 kg   Height: 6\' 3"  (1.905 m)    WM SAHAGUN was evaluated in Emergency Department on 03/29/2019 for  the symptoms described in the history of present illness. He was evaluated in the context of the global COVID-19 pandemic, which necessitated consideration that the patient might be at risk for infection with the SARS-CoV-2 virus that causes COVID-19. Institutional protocols and algorithms that pertain to the evaluation of patients at risk for COVID-19 are in a state of rapid change based on information released by regulatory bodies including the CDC and federal and state organizations. These policies and algorithms were followed during the patient's care in the ED.  The patient's presentation is not typical for COVID.  Patient was advised that a negative test is reassuring but cannot completely rule out COVID infection.  PROCEDURES    ED DIAGNOSES     ICD-10-CM   1. Covid-19 Virus not Detected  [id number]   2. Diarrhea of presumed infectious origin  R19.7        Shanon Rosser, MD 03/29/19 [phone]

## 2019-03-29 NOTE — ED Notes (Signed)
Pt. Said he had a bout of diarrhea today and then he had his temp taken at work today and was told he needed to have a covid test before he could return to work.

## 2019-05-28 ENCOUNTER — Encounter (HOSPITAL_BASED_OUTPATIENT_CLINIC_OR_DEPARTMENT_OTHER): Payer: Self-pay

## 2019-05-28 ENCOUNTER — Emergency Department (HOSPITAL_BASED_OUTPATIENT_CLINIC_OR_DEPARTMENT_OTHER)
Admission: EM | Admit: 2019-05-28 | Discharge: 2019-05-28 | Disposition: A | Discharge status: Home / Self Care | Payer: Self-pay | Attending: Emergency Medicine | Admitting: Emergency Medicine

## 2019-05-28 ENCOUNTER — Other Ambulatory Visit: Payer: Self-pay

## 2019-05-28 DIAGNOSIS — R1031 Right lower quadrant pain: Secondary | ICD-10-CM | POA: Insufficient documentation

## 2019-05-28 DIAGNOSIS — R109 Unspecified abdominal pain: Secondary | ICD-10-CM

## 2019-05-28 DIAGNOSIS — Z79899 Other long term (current) drug therapy: Secondary | ICD-10-CM | POA: Insufficient documentation

## 2019-05-28 DIAGNOSIS — F17210 Nicotine dependence, cigarettes, uncomplicated: Secondary | ICD-10-CM | POA: Insufficient documentation

## 2019-05-28 DIAGNOSIS — Z87442 Personal history of urinary calculi: Secondary | ICD-10-CM | POA: Insufficient documentation

## 2019-05-28 DIAGNOSIS — R300 Dysuria: Secondary | ICD-10-CM | POA: Insufficient documentation

## 2019-05-28 HISTORY — DX: Calculus of kidney: N20.0

## 2019-05-28 LAB — URINALYSIS, ROUTINE W REFLEX MICROSCOPIC
Bilirubin Urine: NEGATIVE
Glucose, UA: NEGATIVE mg/dL
Hgb urine dipstick: NEGATIVE
Ketones, ur: NEGATIVE mg/dL
Nitrite: NEGATIVE
Protein, ur: NEGATIVE mg/dL
Specific Gravity, Urine: 1.015 (ref 1.005–1.030)
pH: 6.5 (ref 5.0–8.0)

## 2019-05-28 LAB — URINALYSIS, MICROSCOPIC (REFLEX): RBC / HPF: NONE SEEN RBC/hpf (ref 0–5)

## 2019-05-28 MED ORDER — METHOCARBAMOL 500 MG PO TABS: 1000.0000 mg | ORAL_TABLET | Freq: Three times a day (TID) | ORAL | 1 refills | Status: AC | PRN

## 2019-05-28 MED ORDER — IBUPROFEN 600 MG PO TABS: 600.0000 mg | ORAL_TABLET | Freq: Four times a day (QID) | ORAL | 0 refills | Status: AC | PRN

## 2019-05-28 MED ORDER — METHOCARBAMOL 500 MG PO TABS
1000.0000 mg | ORAL_TABLET | Freq: Once | ORAL | Status: AC
  Administered 2019-05-28: 1000 mg via ORAL
  Filled 2019-05-28: qty 2

## 2019-05-28 MED ORDER — IBUPROFEN 400 MG PO TABS
600.0000 mg | ORAL_TABLET | Freq: Once | ORAL | Status: AC
  Administered 2019-05-28: 600 mg via ORAL
  Filled 2019-05-28: qty 1

## 2019-05-28 NOTE — Discharge Instructions (Signed)
If you passed a kidney stone your back pain should be steadily improving.  If you have worsening pain, blood in the urine, fever, chills, difficulty urinating or any concerns, return to the emergency department.  Is also possible that you strained your low back muscles.  This can take several weeks to heal.  You may use a heating pad and take ibuprofen and muscle relaxants as needed.

## 2019-05-28 NOTE — ED Triage Notes (Signed)
Pt c/o right lower back pain last week and painful urination-states he feels may have passed a kidney stone-NAD-steady gait

## 2019-05-28 NOTE — Progress Notes (Signed)
TOC CM spoke to pt and states he will sign up for insurance in November during open enrollment. States he started his job in January and his benefits for insurance want start until Jan. 2021. Provided information on his dc instruction for [facility] in [place] he can call to establish with PCP. [place], [place] ED TOC CM [phone]

## 2019-05-28 NOTE — ED Provider Notes (Signed)
[facility]PART[facility] Provider Note   CSN: [id number] Arrival date & time: 05/28/19  1443     History   Chief Complaint Chief Complaint  Patient presents with  . Back Pain    HPI Alan Sullivan is a 33 y.o. male.     HPI Patient states he has a history of kidney stones.  Patient has had 1 week of episodic right flank pain.  Denies any fever or chills.  Earlier today patient had episode of difficulty urinating and dysuria.  Thinks he may have passed a kidney stone at that point.  Denied hematuria.  States his flank pain has significantly improved.  The pain does not radiate into the leg.  Denies weakness or numbness. Past Medical History:  Diagnosis Date  . Kidney stone     There are no active problems to display for this patient.   Past Surgical History:  Procedure Laterality Date  . FRACTURE SURGERY    . KNEE RECONSTRUCTION          Home Medications    Prior to Admission medications   Medication Sig Start Date End Date Taking? Authorizing Provider  diclofenac (VOLTAREN) 75 MG EC tablet Take 1 tablet (75 mg total) by mouth 2 (two) times daily. 08/07/18   Hudnall, Sharyn Lull, MD  gabapentin (NEURONTIN) 300 MG capsule Take 1 capsule (300 mg total) by mouth 3 (three) times daily. 08/07/18   Hudnall, Sharyn Lull, MD  ibuprofen (ADVIL) 600 MG tablet Take 1 tablet (600 mg total) by mouth every 6 (six) hours as needed for moderate pain. 05/28/19   Julianne Rice, MD  loperamide (IMODIUM) 2 MG capsule Take 1 capsule (2 mg total) by mouth 4 (four) times daily as needed for diarrhea or loose stools. 11/02/18   Dorie Rank, MD  methocarbamol (ROBAXIN) 500 MG tablet Take 2 tablets (1,000 mg total) by mouth every 8 (eight) hours as needed. 05/28/19   Julianne Rice, MD  ondansetron (ZOFRAN ODT) 8 MG disintegrating tablet Take 1 tablet (8 mg total) by mouth every 8 (eight) hours as needed for nausea or vomiting. 11/02/18   Dorie Rank, MD    Family History No family  history on file.  Social History Social History   Tobacco Use  . Smoking status: Current Every Day Smoker    Packs/day: 0.50    Types: Cigarettes  . Smokeless tobacco: Never Used  Substance Use Topics  . Alcohol use: No  . Drug use: No     Allergies   Patient has no known allergies.   Review of Systems Review of Systems  Constitutional: Negative for chills and fever.  HENT: Negative for sinus pressure and trouble swallowing.   Eyes: Negative for visual disturbance.  Respiratory: Negative for cough and shortness of breath.   Cardiovascular: Negative for chest pain.  Gastrointestinal: Negative for abdominal distention, abdominal pain, constipation, diarrhea, nausea and vomiting.  Genitourinary: Positive for difficulty urinating, dysuria and flank pain. Negative for frequency, hematuria, penile pain, penile swelling, scrotal swelling and testicular pain.  Musculoskeletal: Positive for back pain and myalgias. Negative for gait problem, neck pain and neck stiffness.  Skin: Negative for rash and wound.  Neurological: Negative for dizziness, facial asymmetry, weakness, light-headedness and numbness.  All other systems reviewed and are negative.    Physical Exam Updated Vital Signs BP 139/78 (BP Location: Left Arm)   Pulse 93   Temp 98.3 F (36.8 C) (Oral)   Resp 18   Ht 6\' 3"  (  1.905 m)   Wt 108.9 kg   SpO2 99%   BMI 30.00 kg/m   Physical Exam Vitals signs and nursing note reviewed.  Constitutional:      General: He is not in acute distress.    Appearance: Normal appearance. He is well-developed. He is not ill-appearing.  HENT:     Head: Normocephalic and atraumatic.     Nose: Nose normal.     Mouth/Throat:     Mouth: Mucous membranes are moist.  Eyes:     Pupils: Pupils are equal, round, and reactive to light.  Neck:     Musculoskeletal: Normal range of motion and neck supple.  Cardiovascular:     Rate and Rhythm: Normal rate and regular rhythm.     Heart  sounds: No murmur. No friction rub. No gallop.   Pulmonary:     Effort: Pulmonary effort is normal. No respiratory distress.     Breath sounds: Normal breath sounds. No stridor. No wheezing, rhonchi or rales.  Chest:     Chest wall: No tenderness.  Abdominal:     General: Bowel sounds are normal. There is no distension.     Palpations: Abdomen is soft. There is no mass.     Tenderness: There is no abdominal tenderness. There is no right CVA tenderness, left CVA tenderness, guarding or rebound.     Hernia: No hernia is present.  Musculoskeletal: Normal range of motion.        General: Tenderness present. No swelling, deformity or signs of injury.     Right lower leg: No edema.     Left lower leg: No edema.     Comments: Patient has mild right paraspinal lumbar tenderness to palpation.  No definite CVA tenderness.  No midline thoracic or lumbar tenderness.  No lower extremity swelling, asymmetry or tenderness.  Distal pulses are 2+.  Skin:    General: Skin is warm and dry.     Findings: No erythema or rash.  Neurological:     General: No focal deficit present.     Mental Status: He is alert and oriented to person, place, and time.     Comments: 5/5 motor in all extremities.  Sensation intact including no saddle anesthesia.  Ambulating without difficulty.  Psychiatric:        Behavior: Behavior normal.      ED Treatments / Results  Labs (all labs ordered are listed, but only abnormal results are displayed) Labs Reviewed  URINALYSIS, ROUTINE W REFLEX MICROSCOPIC - Abnormal; Notable for the following components:      Result Value   Leukocytes,Ua TRACE (*)    All other components within normal limits  URINALYSIS, MICROSCOPIC (REFLEX) - Abnormal; Notable for the following components:   Bacteria, UA RARE (*)    All other components within normal limits    EKG None  Radiology No results found.  Procedures Procedures (including critical care time)  Medications Ordered in ED  Medications  ibuprofen (ADVIL) tablet 600 mg (600 mg Oral Given 05/28/19 1629)  methocarbamol (ROBAXIN) tablet 1,000 mg (1,000 mg Oral Given 05/28/19 1629)     Initial Impression / Assessment and Plan / ED Course  I have reviewed the triage vital signs and the nursing notes.  Pertinent labs & imaging results that were available during my care of the patient were reviewed by me and considered in my medical decision making (see chart for details).        UA without concerning findings.  Normal  neurologic exam.  Suspect lumbar strain versus recently passed kidney stone.  Will treat symptomatically.  Strict return precautions given.  Final Clinical Impressions(s) / ED Diagnoses   Final diagnoses:  Right flank pain    ED Discharge Orders         Ordered    methocarbamol (ROBAXIN) 500 MG tablet  Every 8 hours PRN     05/28/19 1624    ibuprofen (ADVIL) 600 MG tablet  Every 6 hours PRN     05/28/19 1624           Loren Racer, MD 05/28/19 [phone]

## 2020-02-21 ENCOUNTER — Other Ambulatory Visit: Payer: Self-pay

## 2020-02-21 ENCOUNTER — Emergency Department (HOSPITAL_BASED_OUTPATIENT_CLINIC_OR_DEPARTMENT_OTHER)
Admission: EM | Admit: 2020-02-21 | Discharge: 2020-02-21 | Disposition: A | Discharge status: Home / Self Care | Payer: 59 | Attending: Emergency Medicine | Admitting: Emergency Medicine

## 2020-02-21 ENCOUNTER — Encounter (HOSPITAL_BASED_OUTPATIENT_CLINIC_OR_DEPARTMENT_OTHER): Payer: Self-pay | Admitting: Emergency Medicine

## 2020-02-21 DIAGNOSIS — K0889 Other specified disorders of teeth and supporting structures: Secondary | ICD-10-CM | POA: Diagnosis not present

## 2020-02-21 DIAGNOSIS — F17210 Nicotine dependence, cigarettes, uncomplicated: Secondary | ICD-10-CM | POA: Insufficient documentation

## 2020-02-21 MED ORDER — PENICILLIN V POTASSIUM 500 MG PO TABS: 500.0000 mg | ORAL_TABLET | Freq: Four times a day (QID) | ORAL | 0 refills | Status: AC

## 2020-02-21 NOTE — Discharge Instructions (Signed)
You were given a prescription for antibiotics. Please take the antibiotic prescription fully.   Please follow-up with a dentist in the next 5 to 7 days for reevaluation.  If you do not have a dentist, resources were provided for dentist in the area in your discharge summary.  Please contact one of the offices that are listed and make an appointment for follow-up.  Please return to the emergency department for any new or worsening symptoms.  

## 2020-02-21 NOTE — ED Triage Notes (Signed)
R upper dental pain with facial swelling since yesterday.

## 2020-02-21 NOTE — ED Provider Notes (Signed)
[facility] EMERGENCY DEPARTMENT Provider Note   CSN: [id number] Arrival date & time: 02/21/20  1821     History Chief Complaint  Patient presents with  . Dental Pain    Alan POLICE is a 34 y.o. male.  HPI   34 year old male presenting for evaluation of right upper dental pain that started last night.  Pain is constant and severe in nature.  He tried taking Tylenol and BC powder without relief.  He does not have a dentist.  He denies any fevers or other symptoms.  Past Medical History:  Diagnosis Date  . Kidney stone     There are no problems to display for this patient.   Past Surgical History:  Procedure Laterality Date  . FRACTURE SURGERY    . KNEE RECONSTRUCTION         No family history on file.  Social History   Tobacco Use  . Smoking status: Current Every Day Smoker    Packs/day: 0.50    Types: Cigarettes  . Smokeless tobacco: Never Used  Vaping Use  . Vaping Use: Never used  Substance Use Topics  . Alcohol use: No  . Drug use: No    Home Medications Prior to Admission medications   Medication Sig Start Date End Date Taking? Authorizing Provider  diclofenac (VOLTAREN) 75 MG EC tablet Take 1 tablet (75 mg total) by mouth 2 (two) times daily. 08/07/18   Hudnall, Azucena Fallen, MD  gabapentin (NEURONTIN) 300 MG capsule Take 1 capsule (300 mg total) by mouth 3 (three) times daily. 08/07/18   Hudnall, Azucena Fallen, MD  ibuprofen (ADVIL) 600 MG tablet Take 1 tablet (600 mg total) by mouth every 6 (six) hours as needed for moderate pain. 05/28/19   Loren Racer, MD  loperamide (IMODIUM) 2 MG capsule Take 1 capsule (2 mg total) by mouth 4 (four) times daily as needed for diarrhea or loose stools. 11/02/18   Linwood Dibbles, MD  methocarbamol (ROBAXIN) 500 MG tablet Take 2 tablets (1,000 mg total) by mouth every 8 (eight) hours as needed. 05/28/19   Loren Racer, MD  ondansetron (ZOFRAN ODT) 8 MG disintegrating tablet Take 1 tablet (8 mg total) by mouth  every 8 (eight) hours as needed for nausea or vomiting. 11/02/18   Linwood Dibbles, MD  penicillin v potassium (VEETID) 500 MG tablet Take 1 tablet (500 mg total) by mouth 4 (four) times daily for 7 days. 02/21/20 02/28/20  Mercedez Boule S, PA-C    Allergies    Patient has no known allergies.  Review of Systems   Review of Systems  Constitutional: Negative for fever.  HENT: Positive for dental problem.     Physical Exam Updated Vital Signs BP 125/74 (BP Location: Left Arm)   Pulse 69   Temp 99 F (37.2 C) (Oral)   Resp 18   Ht 6\' 3"  (1.905 m)   Wt 102.1 kg   SpO2 99%   BMI 28.12 kg/m   Physical Exam Constitutional:      General: He is not in acute distress.    Appearance: He is well-developed.  HENT:     Mouth/Throat:     Comments: Tooth #4 is fractured and ttp, no periapical abscess. No sublingual or submandibular swelling. No trismus.  Eyes:     Conjunctiva/sclera: Conjunctivae normal.  Cardiovascular:     Rate and Rhythm: Normal rate and regular rhythm.  Pulmonary:     Effort: Pulmonary effort is normal.     Breath  sounds: Normal breath sounds.  Skin:    General: Skin is warm and dry.  Neurological:     Mental Status: He is alert and oriented to person, place, and time.     ED Results / Procedures / Treatments   Labs (all labs ordered are listed, but only abnormal results are displayed) Labs Reviewed - No data to display  EKG None  Radiology No results found.  Procedures Procedures (including critical care time)  Medications Ordered in ED Medications - No data to display  ED Course  I have reviewed the triage vital signs and the nursing notes.  Pertinent labs & imaging results that were available during my care of the patient were reviewed by me and considered in my medical decision making (see chart for details).    MDM Rules/Calculators/A&P                          Patient with toothache.  No gross abscess.  Exam unconcerning for Ludwig's angina  or spread of infection.  Will treat with penicillin and pain medicine.  Urged patient to follow-up with dentist.     Final Clinical Impression(s) / ED Diagnoses Final diagnoses:  Pain, dental    Rx / DC Orders ED Discharge Orders         Ordered    penicillin v potassium (VEETID) 500 MG tablet  4 times daily     Discontinue  Reprint     02/21/20 1956           Rodney Booze, PA-C 02/21/20 1956    Hayden Rasmussen, MD 02/22/20 1104

## 2020-09-02 ENCOUNTER — Emergency Department (HOSPITAL_BASED_OUTPATIENT_CLINIC_OR_DEPARTMENT_OTHER)
Admission: EM | Admit: 2020-09-02 | Discharge: 2020-09-02 | Disposition: A | Discharge status: Home / Self Care | Payer: No Typology Code available for payment source | Attending: Emergency Medicine | Admitting: Emergency Medicine

## 2020-09-02 ENCOUNTER — Encounter (HOSPITAL_BASED_OUTPATIENT_CLINIC_OR_DEPARTMENT_OTHER): Payer: Self-pay | Admitting: Emergency Medicine

## 2020-09-02 ENCOUNTER — Other Ambulatory Visit: Payer: Self-pay

## 2020-09-02 DIAGNOSIS — F17210 Nicotine dependence, cigarettes, uncomplicated: Secondary | ICD-10-CM | POA: Insufficient documentation

## 2020-09-02 DIAGNOSIS — R300 Dysuria: Secondary | ICD-10-CM | POA: Insufficient documentation

## 2020-09-02 DIAGNOSIS — R112 Nausea with vomiting, unspecified: Secondary | ICD-10-CM | POA: Insufficient documentation

## 2020-09-02 DIAGNOSIS — Z20822 Contact with and (suspected) exposure to covid-19: Secondary | ICD-10-CM

## 2020-09-02 DIAGNOSIS — U071 COVID-19: Secondary | ICD-10-CM | POA: Diagnosis not present

## 2020-09-02 DIAGNOSIS — R109 Unspecified abdominal pain: Secondary | ICD-10-CM | POA: Insufficient documentation

## 2020-09-02 DIAGNOSIS — M546 Pain in thoracic spine: Secondary | ICD-10-CM | POA: Insufficient documentation

## 2020-09-02 DIAGNOSIS — R2240 Localized swelling, mass and lump, unspecified lower limb: Secondary | ICD-10-CM | POA: Diagnosis not present

## 2020-09-02 DIAGNOSIS — R059 Cough, unspecified: Secondary | ICD-10-CM | POA: Diagnosis present

## 2020-09-02 LAB — SARS CORONAVIRUS 2 (TAT 6-24 HRS): SARS Coronavirus 2: POSITIVE — AB

## 2020-09-02 NOTE — ED Provider Notes (Signed)
[facility] EMERGENCY DEPARTMENT Provider Note   CSN: [id number] Arrival date & time: 09/02/20  1105     History Chief Complaint  Patient presents with  . Cough    Alan Sullivan is a 34 y.o. male with history significant for kidney stones who presents for evaluation needing COVID test.  Significant other recently tested positive.  He is vaccinated however not boosted.  He has a nonproductive cough x1 day.  Myalgias and no appetite.  He denies any sudden onset thunderclap headache, fever, chills, nausea, vomiting, neck pain, neck stiffness abdominal pain, diarrhea, dysuria, unilateral leg swelling, redness or warmth.  Has not take anything for symptoms.  Denies additional aggravating or alleviating factors.  History obtained from patient and past medical records.  No interpreter used.  HPI     Past Medical History:  Diagnosis Date  . Kidney stone     There are no problems to display for this patient.   Past Surgical History:  Procedure Laterality Date  . FRACTURE SURGERY    . KNEE RECONSTRUCTION         No family history on file.  Social History   Tobacco Use  . Smoking status: Current Every Day Smoker    Packs/day: 0.50    Types: Cigarettes  . Smokeless tobacco: Never Used  Vaping Use  . Vaping Use: Never used  Substance Use Topics  . Alcohol use: No  . Drug use: No    Home Medications Prior to Admission medications   Medication Sig Start Date End Date Taking? Authorizing Provider  diclofenac (VOLTAREN) 75 MG EC tablet Take 1 tablet (75 mg total) by mouth 2 (two) times daily. 08/07/18   Hudnall, Azucena Fallen, MD  gabapentin (NEURONTIN) 300 MG capsule Take 1 capsule (300 mg total) by mouth 3 (three) times daily. 08/07/18   Hudnall, Azucena Fallen, MD  ibuprofen (ADVIL) 600 MG tablet Take 1 tablet (600 mg total) by mouth every 6 (six) hours as needed for moderate pain. 05/28/19   Loren Racer, MD  loperamide (IMODIUM) 2 MG capsule Take 1 capsule (2 mg  total) by mouth 4 (four) times daily as needed for diarrhea or loose stools. 11/02/18   Linwood Dibbles, MD  methocarbamol (ROBAXIN) 500 MG tablet Take 2 tablets (1,000 mg total) by mouth every 8 (eight) hours as needed. 05/28/19   Loren Racer, MD  ondansetron (ZOFRAN ODT) 8 MG disintegrating tablet Take 1 tablet (8 mg total) by mouth every 8 (eight) hours as needed for nausea or vomiting. 11/02/18   Linwood Dibbles, MD    Allergies    Patient has no known allergies.  Review of Systems   Review of Systems  Constitutional: Positive for activity change, appetite change and fatigue.  HENT: Positive for congestion and rhinorrhea.   Respiratory: Positive for cough. Negative for apnea, choking, chest tightness, shortness of breath, wheezing and stridor.   Cardiovascular: Negative.   Gastrointestinal: Negative.   Genitourinary: Negative.   Musculoskeletal: Negative.   Skin: Negative.   Neurological: Negative.   All other systems reviewed and are negative.   Physical Exam Updated Vital Signs BP (!) 131/96 (BP Location: Right Arm)   Pulse 69   Temp 98.3 F (36.8 C) (Oral)   Resp 18   Ht 6\' 3"  (1.905 m)   Wt 99.3 kg   SpO2 96%   BMI 27.37 kg/m   Physical Exam Vitals and nursing note reviewed.  Constitutional:      General: He is not  in acute distress.    Appearance: He is not ill-appearing, toxic-appearing or diaphoretic.  HENT:     Head: Normocephalic and atraumatic.     Jaw: There is normal jaw occlusion.     Right Ear: Tympanic membrane, ear canal and external ear normal. There is no impacted cerumen. No hemotympanum. Tympanic membrane is not injected, scarred, perforated, erythematous, retracted or bulging.     Left Ear: Tympanic membrane, ear canal and external ear normal. There is no impacted cerumen. No hemotympanum. Tympanic membrane is not injected, scarred, perforated, erythematous, retracted or bulging.     Ears:     Comments: No Mastoid tenderness.    Nose:     Comments:  Clear rhinorrhea and congestion to bilateral nares.  No sinus tenderness.    Mouth/Throat:     Comments: Posterior oropharynx clear.  Mucous membranes moist.  Tonsils without erythema or exudate.  Uvula midline without deviation.  No evidence of PTA or RPA.  No drooling, dysphasia or trismus.  Phonation normal. Neck:     Trachea: Trachea and phonation normal.     Meningeal: Brudzinski's sign and Kernig's sign absent.     Comments: No Neck stiffness or neck rigidity.  No meningismus.  No cervical lymphadenopathy. Cardiovascular:     Comments: No murmurs rubs or gallops. Pulmonary:     Comments: Clear to auscultation bilaterally without wheeze, rhonchi or rales.  No accessory muscle usage.  Able speak in full sentences. Abdominal:     Comments: Soft, nontender without rebound or guarding.  No CVA tenderness.  Musculoskeletal:     Comments: Moves all 4 extremities without difficulty.  Lower extremities without edema, erythema or warmth.  Skin:    Comments: Brisk capillary refill.  No rashes or lesions.  Neurological:     Mental Status: He is alert.     Comments: Ambulatory in department without difficulty.  Cranial nerves II through XII grossly intact.  No facial droop.  No aphasia.     ED Results / Procedures / Treatments   Labs (all labs ordered are listed, but only abnormal results are displayed) Labs Reviewed  SARS CORONAVIRUS 2 (TAT 6-24 HRS)    EKG None  Radiology No results found.  Procedures Procedures (including critical care time)  Medications Ordered in ED Medications - No data to display  ED Course  I have reviewed the triage vital signs and the nursing notes.  Pertinent labs & imaging results that were available during my care of the patient were reviewed by me and considered in my medical decision making (see chart for details).  34 year old presents for evaluation needing COVID test.  Significant other recently tested positive.  He is vaccinated however not  boosted.  Heart lungs clear.  Abdomen soft, nontender.  He has no neck stiffness or neck rigidity.  He has no meningismus.  He has no unilateral leg swelling, redness or warmth.  No clinical evidence of DVT.  He has a nonfocal neuro exam without deficits.  He appears clinically well-hydrated.  Covid test obtained here in the ED.  We will follow up with results on MyChart.  Symptomatic management as well as outpatient follow-up and quarantine.  He is agreeable to this  The patient has been appropriately medically screened and/or stabilized in the ED. I have low suspicion for any other emergent medical condition which would require further screening, evaluation or treatment in the ED or require inpatient management.  Patient is hemodynamically stable and in no acute distress.  Patient able to ambulate in department prior to ED.  Evaluation does not show acute pathology that would require ongoing or additional emergent interventions while in the emergency department or further inpatient treatment.  I have discussed the diagnosis with the patient and answered all questions.  Pain is been managed while in the emergency department and patient has no further complaints prior to discharge.  Patient is comfortable with plan discussed in room and is stable for discharge at this time.  I have discussed strict return precautions for returning to the emergency department.  Patient was encouraged to follow-up with PCP/specialist refer to at discharge.    MDM Rules/Calculators/A&P                         Alan Sullivan was evaluated in Emergency Department on 09/02/2020 for the symptoms described in the history of present illness. He was evaluated in the context of the global COVID-19 pandemic, which necessitated consideration that the patient might be at risk for infection with the SARS-CoV-2 virus that causes COVID-19. Institutional protocols and algorithms that pertain to the evaluation of patients at risk for  COVID-19 are in a state of rapid change based on information released by regulatory bodies including the CDC and federal and state organizations. These policies and algorithms were followed during the patient's care in the ED. Final Clinical Impression(s) / ED Diagnoses Final diagnoses:  Person under investigation for COVID-19    Rx / DC Orders ED Discharge Orders    None       Teagon Kron A, PA-C 09/02/20 1317    Vanetta Mulders, MD 09/12/20 0001

## 2020-09-02 NOTE — ED Notes (Signed)
Unable to obtain signature for discharge d/t malfunctioning pad. D/c instructions reviewed, no further questions.

## 2020-09-02 NOTE — ED Triage Notes (Signed)
Reports cough x 2 days , diarrhea , no appetite , body ache.

## 2021-01-10 ENCOUNTER — Emergency Department (HOSPITAL_BASED_OUTPATIENT_CLINIC_OR_DEPARTMENT_OTHER): Payer: No Typology Code available for payment source

## 2021-01-10 ENCOUNTER — Emergency Department (HOSPITAL_BASED_OUTPATIENT_CLINIC_OR_DEPARTMENT_OTHER)
Admission: EM | Admit: 2021-01-10 | Discharge: 2021-01-10 | Disposition: A | Discharge status: Home / Self Care | Payer: No Typology Code available for payment source | Attending: Emergency Medicine | Admitting: Emergency Medicine

## 2021-01-10 ENCOUNTER — Encounter (HOSPITAL_BASED_OUTPATIENT_CLINIC_OR_DEPARTMENT_OTHER): Payer: Self-pay

## 2021-01-10 ENCOUNTER — Other Ambulatory Visit: Payer: Self-pay

## 2021-01-10 DIAGNOSIS — Y9301 Activity, walking, marching and hiking: Secondary | ICD-10-CM | POA: Insufficient documentation

## 2021-01-10 DIAGNOSIS — W231XXA Caught, crushed, jammed, or pinched between stationary objects, initial encounter: Secondary | ICD-10-CM | POA: Diagnosis not present

## 2021-01-10 DIAGNOSIS — M79675 Pain in left toe(s): Secondary | ICD-10-CM | POA: Diagnosis present

## 2021-01-10 DIAGNOSIS — F17210 Nicotine dependence, cigarettes, uncomplicated: Secondary | ICD-10-CM | POA: Insufficient documentation

## 2021-01-10 MED ORDER — ACETAMINOPHEN 325 MG PO TABS
650.0000 mg | ORAL_TABLET | Freq: Once | ORAL | Status: AC
  Administered 2021-01-10: 650 mg via ORAL
  Filled 2021-01-10: qty 2

## 2021-01-10 MED ORDER — IBUPROFEN 400 MG PO TABS
600.0000 mg | ORAL_TABLET | Freq: Once | ORAL | Status: AC
  Administered 2021-01-10: 600 mg via ORAL
  Filled 2021-01-10: qty 1

## 2021-01-10 NOTE — ED Notes (Signed)
Left great toe is slightly red and swollen.

## 2021-01-10 NOTE — Discharge Instructions (Addendum)
Call your primary care doctor or specialist as discussed in the next 2-3 days.   Return immediately back to the ER if:  Your symptoms worsen within the next 12-24 hours. You develop new symptoms such as new fevers, persistent vomiting, new pain, shortness of breath, or new weakness or numbness, or if you have any other concerns.  

## 2021-01-10 NOTE — ED Triage Notes (Signed)
Pt injured left great toe yesterday-NAD-limping gait

## 2021-01-10 NOTE — ED Notes (Signed)
ED Provider at bedside. 

## 2021-01-10 NOTE — ED Provider Notes (Signed)
[facility] EMERGENCY DEPARTMENT Provider Note   CSN: [id number] Arrival date & time: 01/10/21  1629     History Chief Complaint  Patient presents with  . Toe Injury    Alan Sullivan is a 35 y.o. male.  Patient presents with left foot first toe pain.  Patient states that today he was walking upstairs when his foot slipped and he jammed his toe on the stair.  Complaining of persistent pain worse when he puts weight on it.  Denies fevers cough vomiting or diarrhea.  Denies fall or injury elsewhere.        Past Medical History:  Diagnosis Date  . Kidney stone     There are no problems to display for this patient.   Past Surgical History:  Procedure Laterality Date  . FRACTURE SURGERY    . KNEE RECONSTRUCTION         No family history on file.  Social History   Tobacco Use  . Smoking status: Current Every Day Smoker    Packs/day: 0.50    Types: Cigarettes  . Smokeless tobacco: Never Used  Vaping Use  . Vaping Use: Never used  Substance Use Topics  . Alcohol use: No  . Drug use: No    Home Medications Prior to Admission medications   Medication Sig Start Date End Date Taking? Authorizing Provider  diclofenac (VOLTAREN) 75 MG EC tablet Take 1 tablet (75 mg total) by mouth 2 (two) times daily. 08/07/18   Hudnall, Azucena Fallen, MD  gabapentin (NEURONTIN) 300 MG capsule Take 1 capsule (300 mg total) by mouth 3 (three) times daily. 08/07/18   Hudnall, Azucena Fallen, MD  ibuprofen (ADVIL) 600 MG tablet Take 1 tablet (600 mg total) by mouth every 6 (six) hours as needed for moderate pain. 05/28/19   Loren Racer, MD  loperamide (IMODIUM) 2 MG capsule Take 1 capsule (2 mg total) by mouth 4 (four) times daily as needed for diarrhea or loose stools. 11/02/18   Linwood Dibbles, MD  methocarbamol (ROBAXIN) 500 MG tablet Take 2 tablets (1,000 mg total) by mouth every 8 (eight) hours as needed. 05/28/19   Loren Racer, MD  ondansetron (ZOFRAN ODT) 8 MG disintegrating tablet  Take 1 tablet (8 mg total) by mouth every 8 (eight) hours as needed for nausea or vomiting. 11/02/18   Linwood Dibbles, MD    Allergies    Patient has no known allergies.  Review of Systems   Review of Systems  Constitutional: Negative for fever.  HENT: Negative for ear pain and sore throat.   Eyes: Negative for pain.  Respiratory: Negative for cough.   Cardiovascular: Negative for chest pain.  Gastrointestinal: Negative for abdominal pain.  Genitourinary: Negative for flank pain.  Musculoskeletal: Negative for back pain.  Skin: Negative for color change and rash.  Neurological: Negative for syncope.  All other systems reviewed and are negative.   Physical Exam Updated Vital Signs BP (!) 129/93 (BP Location: Left Arm)   Pulse 92   Temp 99 F (37.2 C) (Oral)   Resp 20   Ht 6\' 3"  (1.905 m)   Wt 102.1 kg   SpO2 97%   BMI 28.12 kg/m   Physical Exam Constitutional:      General: He is not in acute distress.    Appearance: He is well-developed.  HENT:     Head: Normocephalic.     Nose: Nose normal.  Eyes:     Extraocular Movements: Extraocular movements intact.  Cardiovascular:  Rate and Rhythm: Normal rate.  Pulmonary:     Effort: Pulmonary effort is normal.  Musculoskeletal:     Comments: Left foot moderately swollen first toe.  No erythema or laceration noted.  No ecchymosis noted.  Tender to palpation diffusely across the first toe and left foot forefoot region.  Skin:    Coloration: Skin is not jaundiced.  Neurological:     Mental Status: He is alert. Mental status is at baseline.     ED Results / Procedures / Treatments   Labs (all labs ordered are listed, but only abnormal results are displayed) Labs Reviewed - No data to display  EKG None  Radiology DG Foot Complete Left  Result Date: 01/10/2021 CLINICAL DATA:  First toe pain EXAM: LEFT FOOT - COMPLETE 3+ VIEW COMPARISON:  None. FINDINGS: There is no evidence of fracture or dislocation. There is no  evidence of arthropathy or other focal bone abnormality. Soft tissues are unremarkable. IMPRESSION: Negative. Electronically Signed   By: Jasmine Pang M.D.   On: 01/10/2021 17:53    Procedures Procedures   Medications Ordered in ED Medications  acetaminophen (TYLENOL) tablet 650 mg (650 mg Oral Given 01/10/21 1719)  ibuprofen (ADVIL) tablet 600 mg (600 mg Oral Given 01/10/21 1719)    ED Course  I have reviewed the triage vital signs and the nursing notes.  Pertinent labs & imaging results that were available during my care of the patient were reviewed by me and considered in my medical decision making (see chart for details).    MDM Rules/Calculators/A&P                          Patient given Tylenol Motrin for pain.  X-rays unremarkable for acute fracture.  Patient placed in a surgical boot.  Discharged home in stable condition advised follow-up with his doctor within the week.  Advising immediate return for worsening pain fevers or any additional concerns.  Final Clinical Impression(s) / ED Diagnoses Final diagnoses:  Pain of toe of left foot    Rx / DC Orders ED Discharge Orders    None       Cheryll Cockayne, MD 01/10/21 1840

## 2022-01-06 IMAGING — DX DG FOOT COMPLETE 3+V*L*
3 series · 3 of 3 positions shown · non-contrast
Comparison: None.

CLINICAL DATA: First toe pain

EXAM:
LEFT FOOT - COMPLETE 3+ VIEW

[foot ap]
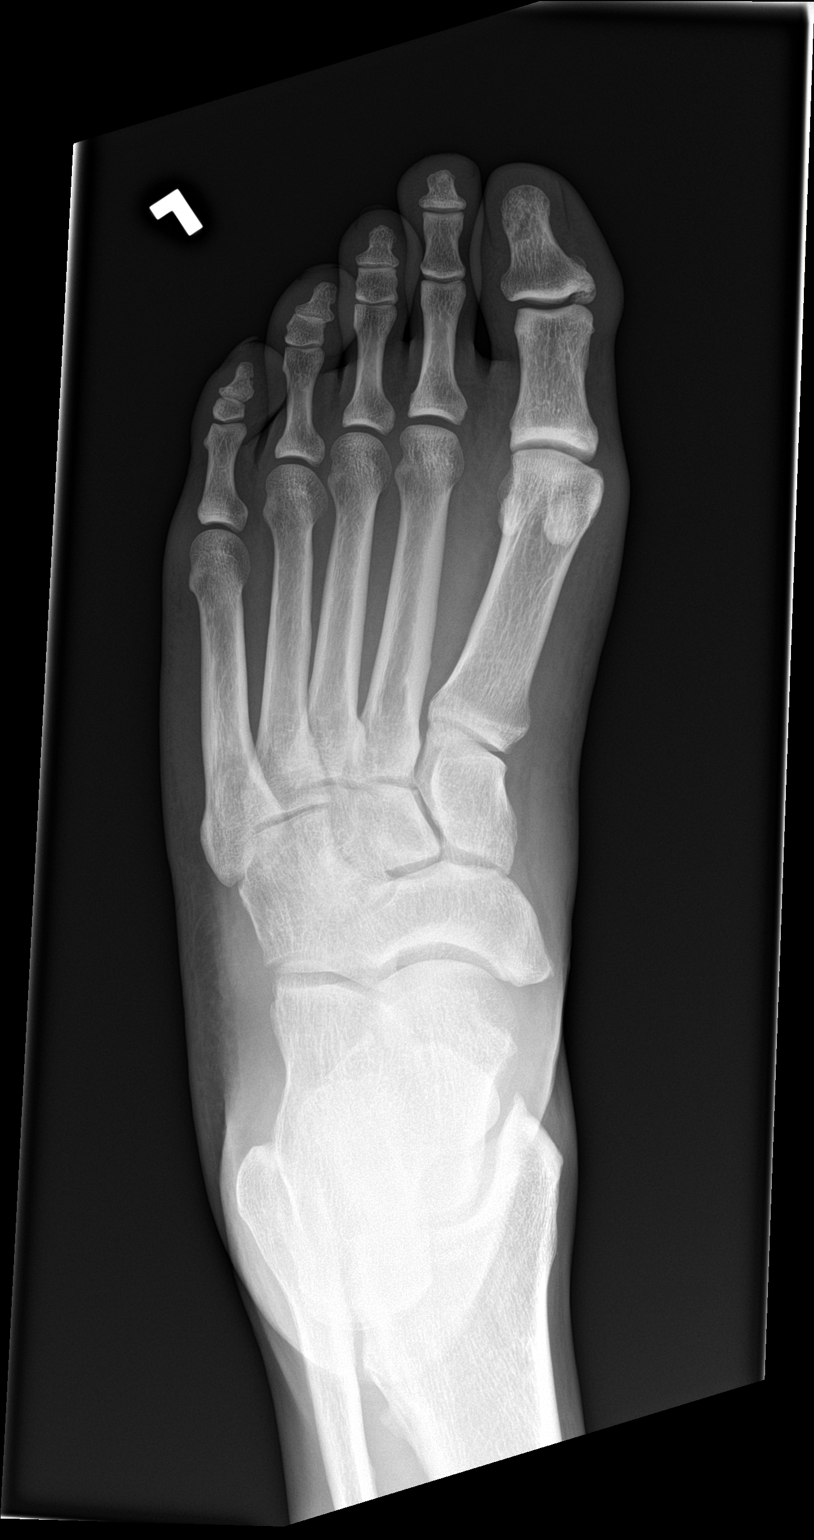

[foot obl]
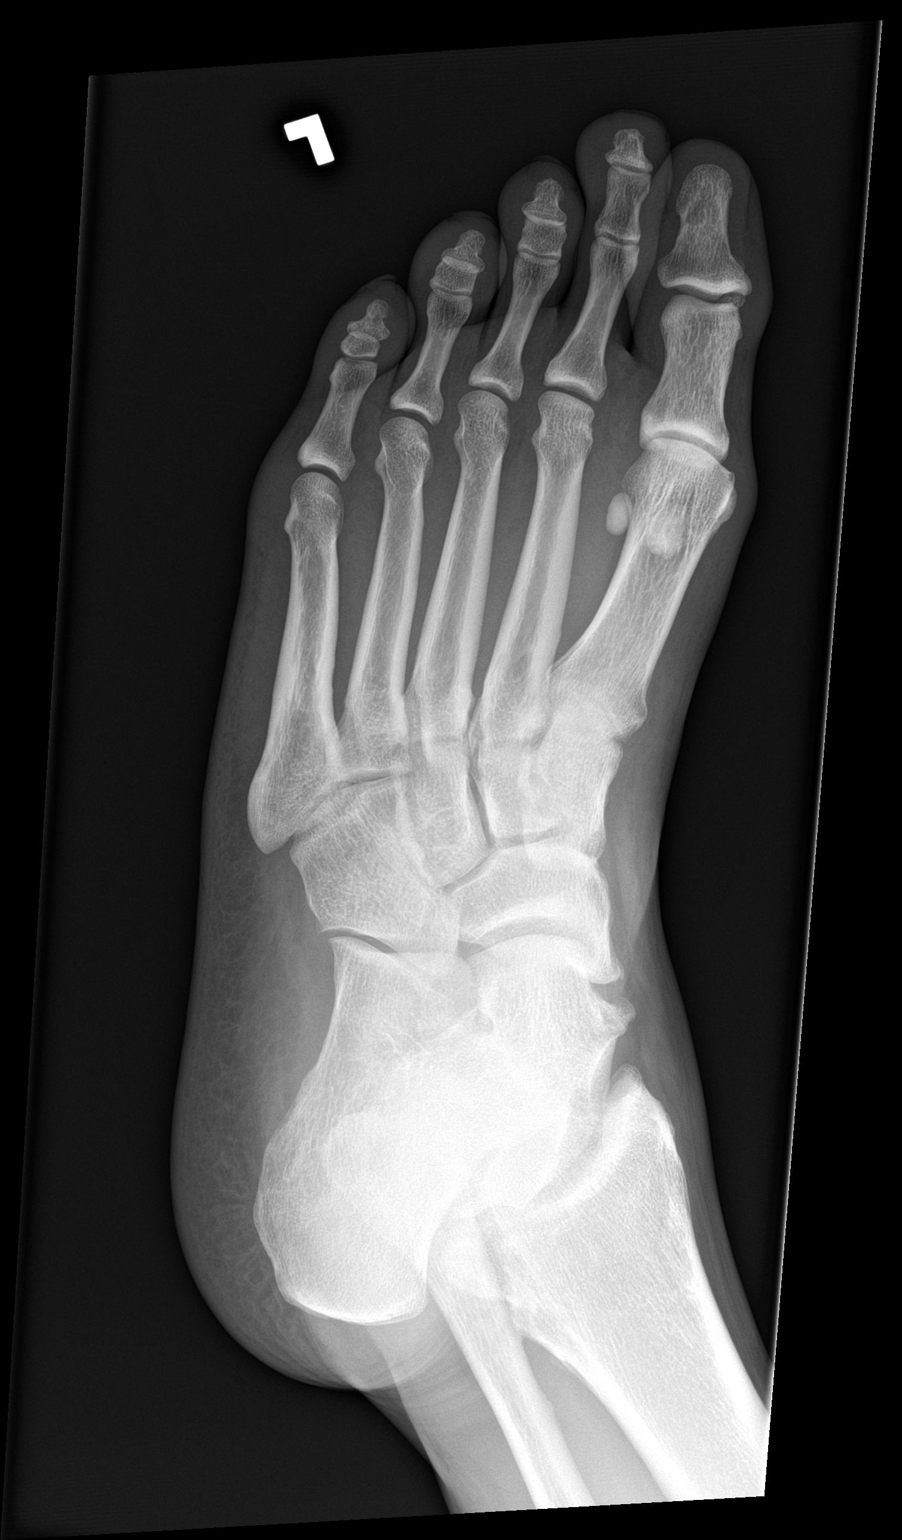

[foot lat]
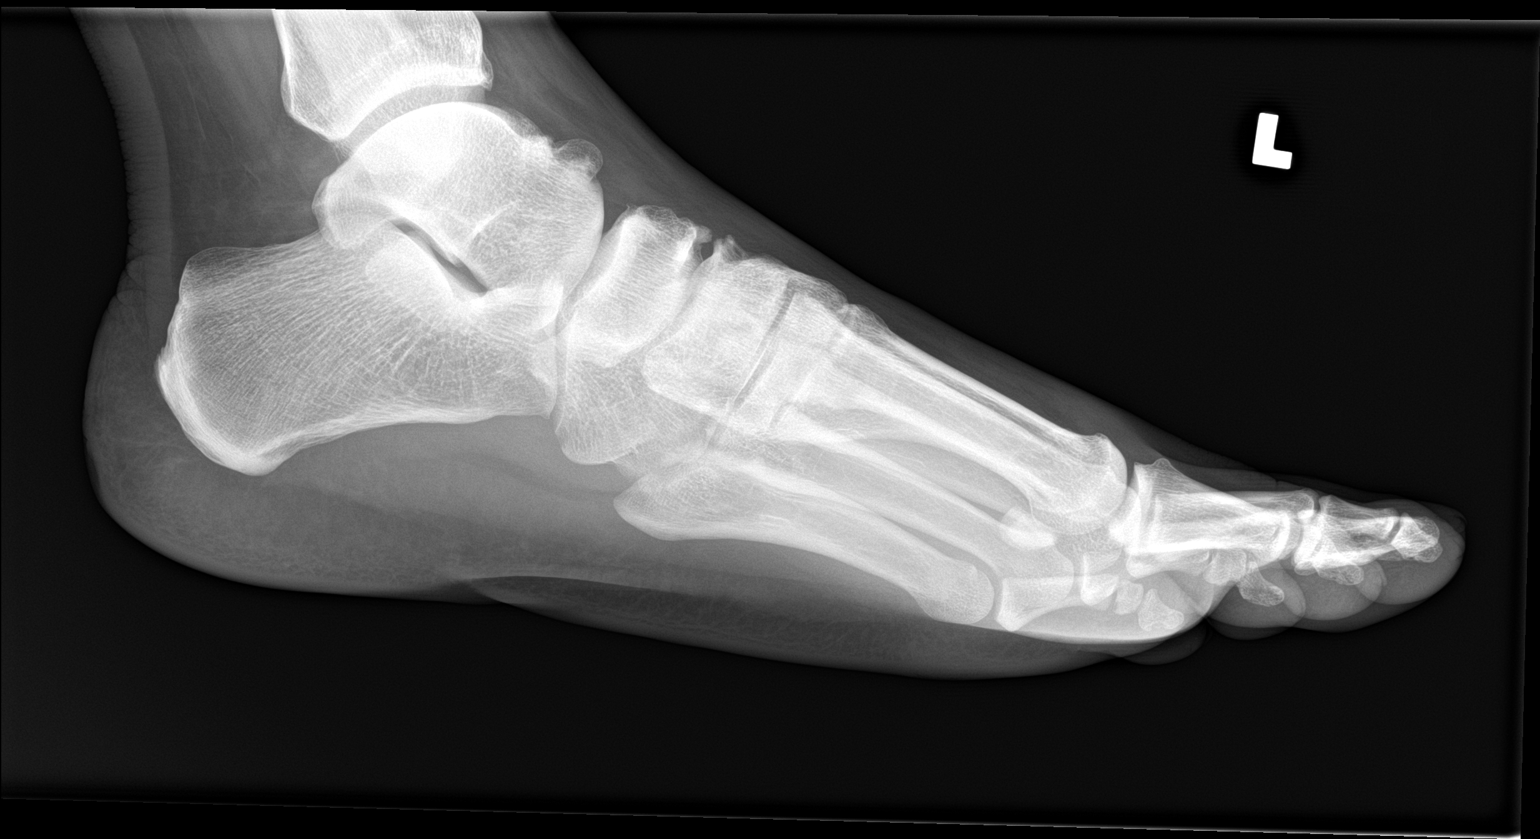

[3 of 3 positions shown; findings below may reference images not displayed]

FINDINGS: There is no evidence of fracture or dislocation. There is no
evidence of arthropathy or other focal bone abnormality. Soft
tissues are unremarkable.
IMPRESSION: Negative.

## 2022-09-11 ENCOUNTER — Other Ambulatory Visit: Payer: Self-pay

## 2022-09-11 ENCOUNTER — Encounter (HOSPITAL_BASED_OUTPATIENT_CLINIC_OR_DEPARTMENT_OTHER): Payer: Self-pay

## 2022-09-11 ENCOUNTER — Emergency Department (HOSPITAL_BASED_OUTPATIENT_CLINIC_OR_DEPARTMENT_OTHER)
Admission: EM | Admit: 2022-09-11 | Discharge: 2022-09-11 | Disposition: A | Discharge status: Home / Self Care | Payer: BC Managed Care – PPO | Attending: Emergency Medicine | Admitting: Emergency Medicine

## 2022-09-11 DIAGNOSIS — M5416 Radiculopathy, lumbar region: Secondary | ICD-10-CM | POA: Insufficient documentation

## 2022-09-11 DIAGNOSIS — R200 Anesthesia of skin: Secondary | ICD-10-CM | POA: Diagnosis present

## 2022-09-11 MED ORDER — KETOROLAC TROMETHAMINE 60 MG/2ML IM SOLN
60.0000 mg | Freq: Once | INTRAMUSCULAR | Status: AC
  Administered 2022-09-11: 60 mg via INTRAMUSCULAR
  Filled 2022-09-11: qty 2

## 2022-09-11 MED ORDER — PREDNISONE 50 MG PO TABS
60.0000 mg | ORAL_TABLET | Freq: Once | ORAL | Status: AC
  Administered 2022-09-11: 60 mg via ORAL
  Filled 2022-09-11: qty 1

## 2022-09-11 MED ORDER — OXYCODONE-ACETAMINOPHEN 5-325 MG PO TABS
2.0000 | ORAL_TABLET | Freq: Once | ORAL | Status: AC
  Administered 2022-09-11: 2 via ORAL
  Filled 2022-09-11: qty 2

## 2022-09-11 MED ORDER — HYDROCODONE-ACETAMINOPHEN 5-325 MG PO TABS: 1.0000 | ORAL_TABLET | Freq: Four times a day (QID) | ORAL | 0 refills | Status: AC | PRN

## 2022-09-11 MED ORDER — PREDNISONE 20 MG PO TABS: ORAL_TABLET | ORAL | 0 refills | Status: AC

## 2022-09-11 NOTE — ED Triage Notes (Addendum)
Patient states he woke up and had a sharp pain on the right side of his body. Patient has a history of back issues and sees a Restaurant manager, fast food. Patient states this pain feels different and woke him up from sleep. Patient feels pain mostly with movement. Pain is rated 8/10, in the right lower back. Patient also has numbness in the right leg. Patient also states he is having pain when producing urine.

## 2022-09-11 NOTE — ED Provider Notes (Signed)
[facility] EMERGENCY DEPARTMENT Provider Note   CSN: [id number] Arrival date & time: 09/11/22  0349     History  Chief Complaint  Patient presents with   Back Pain   Numbness    Alan Sullivan is a 37 y.o. male.  Patient is a 36 year old male presenting with complaints of low back pain.  Patient states he woke from sleep this morning to go to work.  When he rolled out of bed, he felt sudden onset of severe pain to his right lower back/buttock region radiating down his right leg.  He describes associated numbness of the leg.  The pain made it difficult for him to ambulate.  He describes severe pain when he attempts to bear weight or move.  He denies any bowel or bladder complaints.  Patient reports a history of back issues in the past for which she is seeing a chiropractor, but has never been this severe.  He denies any injury or trauma, but does perform repetitive and heavy lifting at work.  The history is provided by the patient.       Home Medications Prior to Admission medications   Medication Sig Start Date End Date Taking? Authorizing Provider  diclofenac (VOLTAREN) 75 MG EC tablet Take 1 tablet (75 mg total) by mouth 2 (two) times daily. 08/07/18   Hudnall, Azucena Fallen, MD  gabapentin (NEURONTIN) 300 MG capsule Take 1 capsule (300 mg total) by mouth 3 (three) times daily. 08/07/18   Hudnall, Azucena Fallen, MD  ibuprofen (ADVIL) 600 MG tablet Take 1 tablet (600 mg total) by mouth every 6 (six) hours as needed for moderate pain. 05/28/19   Loren Racer, MD  loperamide (IMODIUM) 2 MG capsule Take 1 capsule (2 mg total) by mouth 4 (four) times daily as needed for diarrhea or loose stools. 11/02/18   Linwood Dibbles, MD  methocarbamol (ROBAXIN) 500 MG tablet Take 2 tablets (1,000 mg total) by mouth every 8 (eight) hours as needed. 05/28/19   Loren Racer, MD  ondansetron (ZOFRAN ODT) 8 MG disintegrating tablet Take 1 tablet (8 mg total) by mouth every 8 (eight) hours as needed for  nausea or vomiting. 11/02/18   Linwood Dibbles, MD      Allergies    Patient has no known allergies.    Review of Systems   Review of Systems  All other systems reviewed and are negative.   Physical Exam Updated Vital Signs BP 125/73 (BP Location: Right Arm)   Pulse 78   Temp 99.1 F (37.3 C) (Oral)   Resp 17   Ht 6\' 4"  (1.93 m)   Wt 99.8 kg   SpO2 98%   BMI 26.78 kg/m  Physical Exam Vitals and nursing note reviewed.  Constitutional:      General: He is not in acute distress.    Appearance: Normal appearance. He is not ill-appearing.  HENT:     Head: Normocephalic and atraumatic.  Pulmonary:     Effort: Pulmonary effort is normal.  Musculoskeletal:     Comments: There is tenderness to palpation in the soft tissues of the right lower lumbar region/right buttock.  Skin:    General: Skin is warm and dry.  Neurological:     Mental Status: He is alert and oriented to person, place, and time.     Comments: Strength is 5 out of 5 in both lower extremities.  DTRs are trace and symmetrical in the patellar and Achilles tendons bilaterally.  He is able to  ambulate on heels and toes, however with an antalgic gait.     ED Results / Procedures / Treatments   Labs (all labs ordered are listed, but only abnormal results are displayed) Labs Reviewed - No data to display  EKG None  Radiology No results found.  Procedures Procedures    Medications Ordered in ED Medications  ketorolac (TORADOL) injection 60 mg (has no administration in time range)  oxyCODONE-acetaminophen (PERCOCET/ROXICET) 5-325 MG per tablet 2 tablet (has no administration in time range)  predniSONE (DELTASONE) tablet 60 mg (has no administration in time range)    ED Course/ Medical Decision Making/ A&P  Patient presenting here with complaints of low back pain radiating down his right leg.  Patient's symptoms most consistent with a lumbar radiculopathy/sciatica.  There are no red flags on his exam or history  that would suggest an emergent situation.  His reflexes are symmetrical, strength is intact, and there are no bowel or bladder complaints.  Patient to be treated with prednisone and hydrocodone.  To follow-up with primary doctor if not improving in the next week to discuss physical therapy or possibly an MRI.  Final Clinical Impression(s) / ED Diagnoses Final diagnoses:  None    Rx / DC Orders ED Discharge Orders     None         Veryl Speak, MD 09/11/22 [phone]

## 2022-09-11 NOTE — Discharge Instructions (Signed)
Begin taking prednisone as prescribed.  Begin taking hydrocodone as prescribed as needed for pain.  Follow-up with your primary doctor if symptoms are not improving in the next week to discuss physical therapy or imaging studies.

## 2023-03-29 ENCOUNTER — Other Ambulatory Visit: Payer: Self-pay | Admitting: Anesthesiology

## 2023-03-29 DIAGNOSIS — M5416 Radiculopathy, lumbar region: Secondary | ICD-10-CM

## 2023-04-12 ENCOUNTER — Ambulatory Visit
Admission: RE | Admit: 2023-04-12 | Discharge: 2023-04-12 | Disposition: A | Discharge status: Home / Self Care | Payer: BC Managed Care – PPO | Source: Ambulatory Visit | Attending: Anesthesiology | Admitting: Anesthesiology

## 2023-04-12 DIAGNOSIS — M5416 Radiculopathy, lumbar region: Secondary | ICD-10-CM

## 2024-06-20 ENCOUNTER — Other Ambulatory Visit: Payer: Self-pay

## 2024-06-20 ENCOUNTER — Encounter (HOSPITAL_BASED_OUTPATIENT_CLINIC_OR_DEPARTMENT_OTHER): Payer: Self-pay

## 2024-06-20 ENCOUNTER — Emergency Department (HOSPITAL_BASED_OUTPATIENT_CLINIC_OR_DEPARTMENT_OTHER)
Admission: EM | Admit: 2024-06-20 | Discharge: 2024-06-20 | Disposition: A | Discharge status: Home / Self Care | Attending: Emergency Medicine | Admitting: Emergency Medicine

## 2024-06-20 DIAGNOSIS — R197 Diarrhea, unspecified: Secondary | ICD-10-CM | POA: Insufficient documentation

## 2024-06-20 DIAGNOSIS — R112 Nausea with vomiting, unspecified: Secondary | ICD-10-CM | POA: Insufficient documentation

## 2024-06-20 DIAGNOSIS — R748 Abnormal levels of other serum enzymes: Secondary | ICD-10-CM | POA: Insufficient documentation

## 2024-06-20 LAB — URINALYSIS, ROUTINE W REFLEX MICROSCOPIC
Bilirubin Urine: NEGATIVE
Glucose, UA: NEGATIVE mg/dL
Hgb urine dipstick: NEGATIVE
Ketones, ur: NEGATIVE mg/dL
Leukocytes,Ua: NEGATIVE
Nitrite: NEGATIVE
Protein, ur: NEGATIVE mg/dL
Specific Gravity, Urine: 1.025 (ref 1.005–1.030)
pH: 6.5 (ref 5.0–8.0)

## 2024-06-20 LAB — CBC
HCT: 40.9 % (ref 39.0–52.0)
Hemoglobin: 14.2 g/dL (ref 13.0–17.0)
MCH: 28.9 pg (ref 26.0–34.0)
MCHC: 34.7 g/dL (ref 30.0–36.0)
MCV: 83.3 fL (ref 80.0–100.0)
Platelets: 242 K/uL (ref 150–400)
RBC: 4.91 MIL/uL (ref 4.22–5.81)
RDW: 14.6 % (ref 11.5–15.5)
WBC: 9.1 K/uL (ref 4.0–10.5)
nRBC: 0 % (ref 0.0–0.2)

## 2024-06-20 LAB — COMPREHENSIVE METABOLIC PANEL WITH GFR
ALT: 17 U/L (ref 0–44)
AST: 17 U/L (ref 15–41)
Albumin: 4.4 g/dL (ref 3.5–5.0)
Alkaline Phosphatase: 84 U/L (ref 38–126)
Anion gap: 12 (ref 5–15)
BUN: 11 mg/dL (ref 6–20)
CO2: 24 mmol/L (ref 22–32)
Calcium: 9.3 mg/dL (ref 8.9–10.3)
Chloride: 103 mmol/L (ref 98–111)
Creatinine, Ser: 1.14 mg/dL (ref 0.61–1.24)
GFR, Estimated: >60 mL/min (ref >60)
Glucose, Bld: 117 mg/dL — ABNORMAL HIGH (ref 70–99)
Potassium: 3.7 mmol/L (ref 3.5–5.1)
Sodium: 139 mmol/L (ref 135–145)
Total Bilirubin: 0.3 mg/dL (ref 0.0–1.2)
Total Protein: 7 g/dL (ref 6.5–8.1)

## 2024-06-20 LAB — LIPASE, BLOOD: Lipase: 249 U/L — ABNORMAL HIGH (ref 11–51)

## 2024-06-20 MED ORDER — ONDANSETRON 4 MG PO TBDP
4.0000 mg | ORAL_TABLET | Freq: Once | ORAL | Status: AC
  Administered 2024-06-20: 4 mg via ORAL
  Filled 2024-06-20: qty 1

## 2024-06-20 MED ORDER — ONDANSETRON 4 MG PO TBDP: 4.0000 mg | ORAL_TABLET | ORAL | 0 refills | Status: DC | PRN

## 2024-06-20 MED ORDER — ACETAMINOPHEN 500 MG PO TABS
1000.0000 mg | ORAL_TABLET | Freq: Once | ORAL | Status: AC
  Administered 2024-06-20: 1000 mg via ORAL
  Filled 2024-06-20: qty 2

## 2024-06-20 NOTE — ED Provider Notes (Signed)
 [name] Provider Note   CSN: [id number] Arrival date & time: 06/20/24  1746     Patient presents with: Diarrhea and Emesis   Alan Sullivan is a 38 y.o. male.   HPI Patient [place]  He ate some different foods and then when he got home started having episodes of vomiting and diarrhea.  He reports that he had several episodes of vomiting and has had fairly recurrent diarrhea every time he tries to eat something.  He reports he has been drinking a lot of fluids and trying to stay hydrated but he keeps having diarrhea and was worried he had food poisoning.  He reports after the symptoms started he also got a generalized headache but has not had a fever that he knows of.  He is also does not have any abdominal pain.  He denies alcohol consumption.    Prior to Admission medications   Medication Sig Start Date End Date Taking? Authorizing Provider  ondansetron  (ZOFRAN -ODT) 4 MG disintegrating tablet Take 1 tablet (4 mg total) by mouth every 4 (four) hours as needed for nausea or vomiting. 06/20/24  Yes Armenta Canning, MD  diclofenac  (VOLTAREN ) 75 MG EC tablet Take 1 tablet (75 mg total) by mouth 2 (two) times daily. 08/07/18   Hudnall, Ludie SAUNDERS, MD  gabapentin  (NEURONTIN ) 300 MG capsule Take 1 capsule (300 mg total) by mouth 3 (three) times daily. 08/07/18   Hudnall, Ludie SAUNDERS, MD  HYDROcodone -acetaminophen  (NORCO/VICODIN) 5-325 MG tablet Take 1-2 tablets by mouth every 6 (six) hours as needed. 09/11/22   Geroldine Berg, MD  ibuprofen  (ADVIL ) 600 MG tablet Take 1 tablet (600 mg total) by mouth every 6 (six) hours as needed for moderate pain. 05/28/19   Carlyle Lenis, MD  loperamide  (IMODIUM ) 2 MG capsule Take 1 capsule (2 mg total) by mouth 4 (four) times daily as needed for diarrhea or loose stools. 11/02/18   Randol Simmonds, MD  methocarbamol  (ROBAXIN ) 500 MG tablet Take 2 tablets (1,000 mg total) by mouth  every 8 (eight) hours as needed. 05/28/19   Carlyle Lenis, MD  ondansetron  (ZOFRAN  ODT) 8 MG disintegrating tablet Take 1 tablet (8 mg total) by mouth every 8 (eight) hours as needed for nausea or vomiting. 11/02/18   Randol Simmonds, MD  predniSONE  (DELTASONE ) 20 MG tablet 3 Tabs PO Days 1-3, then 2 tabs PO Days 4-6, then 1 tab PO Day 7-9, then Half Tab PO Day 10-12 09/11/22   Geroldine Berg, MD    Allergies: Patient has no known allergies.    Review of Systems  Updated Vital Signs BP 135/89   Pulse 62   Temp 98.1 F (36.7 C)   Resp 15   SpO2 99%   Physical Exam Constitutional:      Comments: Alert nontoxic.  Clinically well in appearance.  No respiratory distress.  HENT:     Head: Normocephalic and atraumatic.     Mouth/Throat:     Mouth: Mucous membranes are moist.     Pharynx: Oropharynx is clear.  Eyes:     Extraocular Movements: Extraocular movements intact.     Conjunctiva/sclera: Conjunctivae normal.     Pupils: Pupils are equal, round, and reactive to light.  Cardiovascular:     Rate and Rhythm: Normal rate and regular rhythm.  Pulmonary:     Effort: Pulmonary effort is normal.     Breath sounds: Normal breath sounds.  Abdominal:  General: There is no distension.     Palpations: Abdomen is soft.     Tenderness: There is no abdominal tenderness. There is no guarding.  Musculoskeletal:        General: No swelling or tenderness. Normal range of motion.     Right lower leg: No edema.     Left lower leg: No edema.  Skin:    General: Skin is warm and dry.  Neurological:     General: No focal deficit present.     Mental Status: He is oriented to person, place, and time.     Motor: No weakness.     Coordination: Coordination normal.  Psychiatric:        Mood and Affect: Mood normal.     (all labs ordered are listed, but only abnormal results are displayed) Labs Reviewed  LIPASE, BLOOD - Abnormal; Notable for the following components:      Result Value   Lipase  249 (*)    All other components within normal limits  COMPREHENSIVE METABOLIC PANEL WITH GFR - Abnormal; Notable for the following components:   Glucose, Bld 117 (*)    All other components within normal limits  CBC  URINALYSIS, ROUTINE W REFLEX MICROSCOPIC    EKG: None  Radiology: No results found.   Procedures   Medications Ordered in the ED  ondansetron  (ZOFRAN -ODT) disintegrating tablet 4 mg (4 mg Oral Given 06/20/24 2113)  acetaminophen  (TYLENOL ) tablet 1,000 mg (1,000 mg Oral Given 06/20/24 2114)                                    Medical Decision Making Amount and/or Complexity of Data Reviewed Labs: ordered.   Patient presents as outlined with onset of vomiting and diarrhea after having been at the fair yesterday.  He has not had fevers and he does not have associated abdominal pain.  Patient reports he became concerned because he had recurrent episodes of diarrhea after trying to eat and hydrate.  He became worried that he might have food poisoning.  Labs drawn through triage.  Urinalysis negative.  Metabolic panel normal with normal LFTs and GFR.  Lipase 249.  CBC normal.  Patient has mild elevation of lipase.  He has no associated abdominal pain and denies alcohol consumption.  His main focus was diarrhea with concern for food poisoning.  Patient reports that he vomited several times but now has been hydrating a lot.  At this time I do feel he is stable to continue oral hydration and treatment with Zofran  for nausea if needed.  We discussed food poisoning versus viral gastroenteritis.  At baseline patient is healthy and well in appearance.  He does not show signs of clinical dehydration.  We discussed the elevation in lipase which may be due to your recent GI illness.  Patient is comfortable going home and taking Zofran  and hydrating.  I reinforced the necessity of getting a repeat blood draw to make sure that the lipase corrects after his symptoms are resolved.   Patient voices understanding.  We reviewed strict return precautions for abdominal pain, persistent vomiting or any worsening symptoms.     Final diagnoses:  Nausea vomiting and diarrhea  Abnormal serum level of lipase    ED Discharge Orders          Ordered    ondansetron  (ZOFRAN -ODT) 4 MG disintegrating tablet  Every 4 hours PRN  06/20/24 2129               Armenta Canning, MD 06/20/24 2138

## 2024-06-20 NOTE — ED Notes (Signed)
 D/c paperwork reviewed with pt, including prescriptions and follow up care.  All questions and/or concerns addressed at time of d/c.  No further needs expressed. . Pt verbalized understanding, Ambulatory without assistance to ED exit, NAD.

## 2024-06-20 NOTE — Discharge Instructions (Signed)
 1.  Continue to hydrate and eat very bland low-fat foods. 2.  Vomiting and diarrhea can often be caused by a virus or potentially a food poisoning.  Both of these conditions most often go away on their own after a couple of days.  It is important to stay well-hydrated.  If you do not feel like you can stay hydrated by taking oral fluids, you are getting weak or dizzy, return to the emergency department. 3.  One of your lab values were elevated.  This is a lipase.  It was not a severe elevation but this is something that indicates the pancreas may be getting inflamed.  You are not having any pain at this time.  Avoid all fats, no alcohol.  You need a recheck of this lab in about 2 to 3 days.  Try to get established with a family doctor.  If you cannot return to the emergency department for recheck. 4.  You have been given a prescription for medication called Zofran .  You may take this for nausea and vomiting.  Continue to sip frequent small amounts of fluids.  You may take extra strength Tylenol  every 6 hours for body aches headache or fever.

## 2024-06-20 NOTE — ED Triage Notes (Signed)
 States [place].   Diarrhea, emesis, nausea, headache since Denies abd pain, cough, fevers

## 2024-09-03 ENCOUNTER — Other Ambulatory Visit: Payer: Self-pay

## 2024-09-03 ENCOUNTER — Emergency Department (HOSPITAL_BASED_OUTPATIENT_CLINIC_OR_DEPARTMENT_OTHER)
Admission: EM | Admit: 2024-09-03 | Discharge: 2024-09-03 | Disposition: A | Discharge status: Home / Self Care | Attending: Emergency Medicine | Admitting: Emergency Medicine

## 2024-09-03 ENCOUNTER — Encounter (HOSPITAL_BASED_OUTPATIENT_CLINIC_OR_DEPARTMENT_OTHER): Payer: Self-pay | Admitting: Emergency Medicine

## 2024-09-03 DIAGNOSIS — R197 Diarrhea, unspecified: Secondary | ICD-10-CM | POA: Insufficient documentation

## 2024-09-03 DIAGNOSIS — R1011 Right upper quadrant pain: Secondary | ICD-10-CM | POA: Insufficient documentation

## 2024-09-03 DIAGNOSIS — R112 Nausea with vomiting, unspecified: Secondary | ICD-10-CM | POA: Insufficient documentation

## 2024-09-03 DIAGNOSIS — R1010 Upper abdominal pain, unspecified: Secondary | ICD-10-CM | POA: Diagnosis present

## 2024-09-03 DIAGNOSIS — R1013 Epigastric pain: Secondary | ICD-10-CM | POA: Diagnosis not present

## 2024-09-03 LAB — CBC WITH DIFFERENTIAL/PLATELET
Abs Immature Granulocytes: 0.03 K/uL (ref 0.00–0.07)
Basophils Absolute: 0 K/uL (ref 0.0–0.1)
Basophils Relative: 0 %
Eosinophils Absolute: 0.2 K/uL (ref 0.0–0.5)
Eosinophils Relative: 2 %
HCT: 42.4 % (ref 39.0–52.0)
Hemoglobin: 15.2 g/dL (ref 13.0–17.0)
Immature Granulocytes: 0 %
Lymphocytes Relative: 21 %
Lymphs Abs: 2.1 K/uL (ref 0.7–4.0)
MCH: 28.8 pg (ref 26.0–34.0)
MCHC: 35.8 g/dL (ref 30.0–36.0)
MCV: 80.5 fL (ref 80.0–100.0)
Monocytes Absolute: 1.1 K/uL — ABNORMAL HIGH (ref 0.1–1.0)
Monocytes Relative: 11 %
Neutro Abs: 6.6 K/uL (ref 1.7–7.7)
Neutrophils Relative %: 66 %
Platelets: 236 K/uL (ref 150–400)
RBC: 5.27 MIL/uL (ref 4.22–5.81)
RDW: 14.2 % (ref 11.5–15.5)
WBC: 10.1 K/uL (ref 4.0–10.5)
nRBC: 0 % (ref 0.0–0.2)

## 2024-09-03 LAB — COMPREHENSIVE METABOLIC PANEL WITH GFR
ALT: 15 U/L (ref 0–44)
AST: 17 U/L (ref 15–41)
Albumin: 4.5 g/dL (ref 3.5–5.0)
Alkaline Phosphatase: 87 U/L (ref 38–126)
Anion gap: 12 (ref 5–15)
BUN: 8 mg/dL (ref 6–20)
CO2: 23 mmol/L (ref 22–32)
Calcium: 8.7 mg/dL — ABNORMAL LOW (ref 8.9–10.3)
Chloride: 102 mmol/L (ref 98–111)
Creatinine, Ser: 1.11 mg/dL (ref 0.61–1.24)
GFR, Estimated: >60 mL/min
Glucose, Bld: 123 mg/dL — ABNORMAL HIGH (ref 70–99)
Potassium: 3.8 mmol/L (ref 3.5–5.1)
Sodium: 137 mmol/L (ref 135–145)
Total Bilirubin: 0.6 mg/dL (ref 0.0–1.2)
Total Protein: 7.1 g/dL (ref 6.5–8.1)

## 2024-09-03 LAB — LIPASE, BLOOD: Lipase: 28 U/L (ref 11–51)

## 2024-09-03 MED ORDER — ONDANSETRON 4 MG PO TBDP: 4.0000 mg | ORAL_TABLET | ORAL | 0 refills | Status: AC | PRN

## 2024-09-03 MED ORDER — METOCLOPRAMIDE HCL 5 MG/ML IJ SOLN
10.0000 mg | Freq: Once | INTRAMUSCULAR | Status: AC
  Administered 2024-09-03: 10 mg via INTRAVENOUS
  Filled 2024-09-03: qty 2

## 2024-09-03 MED ORDER — ONDANSETRON HCL 4 MG/2ML IJ SOLN
4.0000 mg | Freq: Once | INTRAMUSCULAR | Status: AC
  Administered 2024-09-03: 4 mg via INTRAVENOUS
  Filled 2024-09-03: qty 2

## 2024-09-03 MED ORDER — MORPHINE SULFATE (PF) 4 MG/ML IV SOLN
4.0000 mg | Freq: Once | INTRAVENOUS | Status: AC
  Administered 2024-09-03: 4 mg via INTRAVENOUS
  Filled 2024-09-03: qty 1

## 2024-09-03 MED ORDER — SODIUM CHLORIDE 0.9 % IV BOLUS
1000.0000 mL | Freq: Once | INTRAVENOUS | Status: AC
  Administered 2024-09-03: 1000 mL via INTRAVENOUS

## 2024-09-03 NOTE — ED Provider Notes (Signed)
 I assumed care from Dr. Emil.  I independently interpreted patient's labs and CBC, CMP and lipase are all now within normal limits.  Speaking with the patient he has had significant diarrhea and intermittent vomiting for the last 3 days.  He has epigastric discomfort but no right upper quadrant pain at this time.  Patient's symptoms seem to be related to foodborne illness versus viral etiology.  Also having fever and chills at home.  No blood in his stool no recent antibiotic use with low suspicion for C. difficile at this time.  Patient's vital signs are reassuring.  He has tried some Imodium  at home but does not have any nausea medicine.  He is improved after fluids and antiemetics.   Doretha Folks, MD 09/03/24 [phone]

## 2024-09-03 NOTE — ED Triage Notes (Signed)
 Upper mid abdominal pain, with diarrhea and sweats. X 2 days

## 2024-09-03 NOTE — ED Provider Notes (Signed)
 " [facility] Provider Note   CSN: [id number] Arrival date & time: 09/03/24  9381     Patient presents with: Abdominal Pain   Alan Sullivan is a 38 y.o. male.   38 yo M with a chief complaints of nausea vomiting diarrhea and upper abdominal discomfort.  This been going on for a couple days now.  Having some fevers at home as well.  Denies dark stool or blood in his stool.  He thinks he had something similar earlier this month.  Was seen in the ER for it.  Was unsure what they had told him and never followed up.   Abdominal Pain      Prior to Admission medications  Medication Sig Start Date End Date Taking? Authorizing Provider  diclofenac  (VOLTAREN ) 75 MG EC tablet Take 1 tablet (75 mg total) by mouth 2 (two) times daily. 08/07/18   Hudnall, Ludie SAUNDERS, MD  gabapentin  (NEURONTIN ) 300 MG capsule Take 1 capsule (300 mg total) by mouth 3 (three) times daily. 08/07/18   Hudnall, Ludie SAUNDERS, MD  HYDROcodone -acetaminophen  (NORCO/VICODIN) 5-325 MG tablet Take 1-2 tablets by mouth every 6 (six) hours as needed. 09/11/22   Geroldine Berg, MD  ibuprofen  (ADVIL ) 600 MG tablet Take 1 tablet (600 mg total) by mouth every 6 (six) hours as needed for moderate pain. 05/28/19   Carlyle Lenis, MD  loperamide  (IMODIUM ) 2 MG capsule Take 1 capsule (2 mg total) by mouth 4 (four) times daily as needed for diarrhea or loose stools. 11/02/18   Randol Simmonds, MD  methocarbamol  (ROBAXIN ) 500 MG tablet Take 2 tablets (1,000 mg total) by mouth every 8 (eight) hours as needed. 05/28/19   Carlyle Lenis, MD  ondansetron  (ZOFRAN  ODT) 8 MG disintegrating tablet Take 1 tablet (8 mg total) by mouth every 8 (eight) hours as needed for nausea or vomiting. 11/02/18   Randol Simmonds, MD  ondansetron  (ZOFRAN -ODT) 4 MG disintegrating tablet Take 1 tablet (4 mg total) by mouth every 4 (four) hours as needed for nausea or vomiting. 06/20/24   Armenta Canning, MD  predniSONE  (DELTASONE ) 20 MG  tablet 3 Tabs PO Days 1-3, then 2 tabs PO Days 4-6, then 1 tab PO Day 7-9, then Half Tab PO Day 10-12 09/11/22   Geroldine Berg, MD    Allergies: Patient has no known allergies.    Review of Systems  Gastrointestinal:  Positive for abdominal pain.    Updated Vital Signs BP 121/69 (BP Location: Right Arm)   Pulse 74   Temp 98.9 F (37.2 C) (Oral)   Resp 20   Ht 6' 3 (1.905 m)   Wt 113.4 kg   SpO2 97%   BMI 31.25 kg/m   Physical Exam Vitals and nursing note reviewed.  Constitutional:      Appearance: He is well-developed.  HENT:     Head: Normocephalic and atraumatic.  Eyes:     Pupils: Pupils are equal, round, and reactive to light.  Neck:     Vascular: No JVD.  Cardiovascular:     Rate and Rhythm: Normal rate and regular rhythm.     Heart sounds: No murmur heard.    No friction rub. No gallop.  Pulmonary:     Effort: No respiratory distress.     Breath sounds: No wheezing.  Abdominal:     General: There is no distension.     Tenderness: There is abdominal tenderness. There is no guarding or rebound.  Comments: Tenderness to the epigastrium and right upper quadrant.  Negative Murphy sign.  Musculoskeletal:        General: Normal range of motion.     Cervical back: Normal range of motion and neck supple.  Skin:    Coloration: Skin is not pale.     Findings: No rash.  Neurological:     Mental Status: He is alert and oriented to person, place, and time.  Psychiatric:        Behavior: Behavior normal.     (all labs ordered are listed, but only abnormal results are displayed) Labs Reviewed  CBC WITH DIFFERENTIAL/PLATELET - Abnormal; Notable for the following components:      Result Value   Monocytes Absolute 1.1 (*)    All other components within normal limits  COMPREHENSIVE METABOLIC PANEL WITH GFR  LIPASE, BLOOD    EKG: None  Radiology: No results found.   Procedures   Medications Ordered in the ED  sodium chloride  0.9 % bolus 1,000 mL (1,000  mLs Intravenous New Bag/Given 09/03/24 9361)  morphine (PF) 4 MG/ML injection 4 mg (4 mg Intravenous Given 09/03/24 9361)  ondansetron  (ZOFRAN ) injection 4 mg (4 mg Intravenous Given 09/03/24 9361)                                    Medical Decision Making Amount and/or Complexity of Data Reviewed Labs: ordered.  Risk Prescription drug management.   38 yo M with a chief complaints of abdominal pain nausea vomiting and diarrhea.  Going on for a couple days now.  His daughter has been sick but with what sounds like an upper respiratory illness.  On record review patient was seen in the ED about 2 weeks ago and looks like might have had pancreatitis.  Patient care signed out to Dr. Doretha, please see their note for further details care in the ED.  The patients results and plan were reviewed and discussed.   Any x-rays performed were independently reviewed by myself.   Differential diagnosis were considered with the presenting HPI.  Medications  sodium chloride  0.9 % bolus 1,000 mL (1,000 mLs Intravenous New Bag/Given 09/03/24 9361)  morphine (PF) 4 MG/ML injection 4 mg (4 mg Intravenous Given 09/03/24 9361)  ondansetron  (ZOFRAN ) injection 4 mg (4 mg Intravenous Given 09/03/24 9361)    Vitals:   09/03/24 0626 09/03/24 0632  BP:  121/69  Pulse:  74  Resp:  20  Temp:  98.9 F (37.2 C)  TempSrc:  Oral  SpO2:  97%  Weight: 113.4 kg   Height: 6' 3 (1.905 m)     Final diagnoses:  Epigastric abdominal pain  Nausea vomiting and diarrhea        Final diagnoses:  Epigastric abdominal pain  Nausea vomiting and diarrhea    ED Discharge Orders     None          Emil Share, DO 09/03/24 9292  "

## 2024-09-03 NOTE — Discharge Instructions (Addendum)
 Continue doing frequent fluids and eat a very bland diet such as chicken noodle soup or broth, Jell-O, applesauce, bananas.  Eat small amounts at a time.  You can use the nausea medicine as needed.  All your labs were normal today. You most likely have a viral illness or food poisoning.  If your symptoms continue for more than 2 weeks you would need testing of your stool.  If you develop bloody diarrhea, persistently high fever, pain that has moved in your abdomen to another location return to the emergency room.
